# Patient Record
Sex: Female | Born: 2000 | Race: Black or African American | Hispanic: No | Marital: Single | Smoking: Never smoker
Health system: Southern US, Community
[De-identification: ages and names within clinical notes are randomized; demographics above are authoritative.]

## PROBLEM LIST (undated history)

## (undated) DIAGNOSIS — J45909 Unspecified asthma, uncomplicated: Secondary | ICD-10-CM

## (undated) DIAGNOSIS — G4730 Sleep apnea, unspecified: Secondary | ICD-10-CM

## (undated) HISTORY — PX: TONSILLECTOMY: SUR1361

## (undated) HISTORY — PX: WISDOM TOOTH EXTRACTION: SHX21

## (undated) HISTORY — PX: ADENOIDECTOMY: SUR15

---
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
## 2018-02-19 ENCOUNTER — Encounter (HOSPITAL_COMMUNITY): Payer: Self-pay | Admitting: Emergency Medicine

## 2018-02-19 ENCOUNTER — Ambulatory Visit (HOSPITAL_COMMUNITY)
Admission: EM | Admit: 2018-02-19 | Discharge: 2018-02-19 | Disposition: A | Discharge status: Home / Self Care | Payer: Medicaid Other | Attending: Internal Medicine | Admitting: Internal Medicine

## 2018-02-19 ENCOUNTER — Other Ambulatory Visit: Payer: Self-pay

## 2018-02-19 DIAGNOSIS — A084 Viral intestinal infection, unspecified: Secondary | ICD-10-CM | POA: Diagnosis not present

## 2018-02-19 DIAGNOSIS — J4521 Mild intermittent asthma with (acute) exacerbation: Secondary | ICD-10-CM | POA: Diagnosis not present

## 2018-02-19 MED ORDER — ALBUTEROL SULFATE HFA 108 (90 BASE) MCG/ACT IN AERS: 1.0000 | INHALATION_SPRAY | Freq: Four times a day (QID) | RESPIRATORY_TRACT | 0 refills | Status: AC | PRN

## 2018-02-19 NOTE — ED Triage Notes (Signed)
The patient presented to the [facility] with a complaint of a cough x 4 days. 

## 2018-02-19 NOTE — ED Provider Notes (Signed)
[facility]    CSN: [id number] Arrival date & time: 02/19/18  1433     History   Chief Complaint Chief Complaint  Patient presents with  . Cough    HPI Heidi Petersen is a 18 y.o. female comes to the urgent care department on account of cough, sore throat abdominal cramping and diarrhea.  Patient denies any fever or chills.  Appetite remains fair.  Diarrhea is nonbloody.  Is associated with abdominal cramping.  No nausea or vomiting.  Patient has not tried any over-the-counter medications for relief.  HPI  Past Medical History:  Diagnosis Date  . Asthma   . Prematurity   . Sleep apnea     There are no active problems to display for this patient.   Past Surgical History:  Procedure Laterality Date  . ADENOIDECTOMY    . TONSILLECTOMY      OB History   No obstetric history on file.      Home Medications    Prior to Admission medications   Medication Sig Start Date End Date Taking? Authorizing Provider  cetirizine (ZYRTEC) 1 MG/ML syrup Take 10 mLs (10 mg total) by mouth daily. 04/08/12  Yes Lowanda Foster, NP  ibuprofen (ADVIL,MOTRIN) 100 MG/5ML suspension Take 200 mg by mouth every 6 (six) hours as needed for fever.   Yes [provider]  albuterol (PROVENTIL HFA;VENTOLIN HFA) 108 (90 Base) MCG/ACT inhaler Inhale 1-2 puffs into the lungs every 6 (six) hours as needed for wheezing or shortness of breath. 02/19/18   Skilynn Durney, Britta Mccreedy, MD    Family History History reviewed. No pertinent family history.  Social History Social History   Tobacco Use  . Smoking status: Never Smoker  Substance Use Topics  . Alcohol use: No  . Drug use: No     Allergies   Augmentin [amoxicillin-pot clavulanate] and Palgic [carbinoxamine]   Review of Systems Review of Systems  Constitutional: Negative for activity change, appetite change and chills.  HENT: Positive for congestion, rhinorrhea and sore throat. Negative for ear discharge, ear pain, postnasal  drip and voice change.   Eyes: Negative for pain, discharge and itching.  Respiratory: Negative for cough, chest tightness, shortness of breath and wheezing.   Gastrointestinal: Positive for diarrhea. Negative for abdominal distention, abdominal pain, constipation, nausea and vomiting.  Genitourinary: Negative for dysuria, hematuria and urgency.  Musculoskeletal: Negative for arthralgias, back pain and joint swelling.  Allergic/Immunologic: Negative for food allergies.  Neurological: Negative for dizziness, syncope and numbness.  Hematological: Negative for adenopathy.     Physical Exam Triage Vital Signs ED Triage Vitals  Enc Vitals Group     BP 02/19/18 1436 128/69     Pulse Rate 02/19/18 1436 84     Resp 02/19/18 1436 16     Temp 02/19/18 1436 98.9 F (37.2 C)     Temp Source 02/19/18 1436 Temporal     SpO2 02/19/18 1436 99 %     Weight --      Height --      Head Circumference --      Peak Flow --      Pain Score 02/19/18 1437 0     Pain Loc --      Pain Edu? --      Excl. in GC? --    No data found.  Updated Vital Signs BP 128/69 (BP Location: Right Arm)   Pulse 84   Temp 98.9 F (37.2 C) (Temporal)   Resp 16  LMP 01/18/2018   SpO2 99%   Visual Acuity Right Eye Distance:   Left Eye Distance:   Bilateral Distance:    Right Eye Near:   Left Eye Near:    Bilateral Near:     Physical Exam Constitutional:      Appearance: She is not ill-appearing.  HENT:     Right Ear: Tympanic membrane normal.     Left Ear: Tympanic membrane normal.     Nose: No congestion or rhinorrhea.     Mouth/Throat:     Mouth: Mucous membranes are moist.     Pharynx: No oropharyngeal exudate or posterior oropharyngeal erythema.  Eyes:     Conjunctiva/sclera: Conjunctivae normal.     Pupils: Pupils are equal, round, and reactive to light.  Neck:     Musculoskeletal: Normal range of motion. No neck rigidity.  Cardiovascular:     Rate and Rhythm: Normal rate and regular rhythm.      Pulses: Normal pulses.     Heart sounds: No murmur. No gallop.   Pulmonary:     Effort: Pulmonary effort is normal. No respiratory distress.     Breath sounds: Normal breath sounds. No rales.  Abdominal:     General: Bowel sounds are normal. There is no distension.     Tenderness: There is no abdominal tenderness. There is no guarding or rebound.  Skin:    General: Skin is warm and dry.  Neurological:     Mental Status: She is alert.      UC Treatments / Results  Labs (all labs ordered are listed, but only abnormal results are displayed) Labs Reviewed - No data to display  EKG None  Radiology No results found.  Procedures Procedures (including critical care time)  Medications Ordered in UC Medications - No data to display  Initial Impression / Assessment and Plan / UC Course  I have reviewed the triage vital signs and the nursing notes.  Pertinent labs & imaging results that were available during my care of the patient were reviewed by me and considered in my medical decision making (see chart for details).    1.  Acute viral gastroenteritis: Encourage oral fluid intake Hopefully diarrhea will subside  2.  Mild intermittent asthma with acute exacerbation: Albuterol every 6 as needed for wheezing  Final Clinical Impressions(s) / UC Diagnoses   Final diagnoses:  Viral gastroenteritis  Mild intermittent asthma with acute exacerbation   Discharge Instructions   None    ED Prescriptions    Medication Sig Dispense Auth. Provider   albuterol (PROVENTIL HFA;VENTOLIN HFA) 108 (90 Base) MCG/ACT inhaler Inhale 1-2 puffs into the lungs every 6 (six) hours as needed for wheezing or shortness of breath. 1 Inhaler Marvelle Span, Britta Mccreedy, MD     Controlled Substance Prescriptions [place] Controlled Substance Registry consulted? No   Merrilee Jansky, MD 02/20/18 [phone]

## 2019-08-27 ENCOUNTER — Ambulatory Visit (INDEPENDENT_AMBULATORY_CARE_PROVIDER_SITE_OTHER): Payer: Medicaid Other | Admitting: Family Medicine

## 2019-08-27 ENCOUNTER — Encounter: Payer: Self-pay | Admitting: Family Medicine

## 2019-08-27 ENCOUNTER — Other Ambulatory Visit: Payer: Self-pay

## 2019-08-27 VITALS — BP 92/62 | HR 85 | Wt 119.0 lb

## 2019-08-27 DIAGNOSIS — J4520 Mild intermittent asthma, uncomplicated: Secondary | ICD-10-CM | POA: Diagnosis not present

## 2019-08-27 DIAGNOSIS — F411 Generalized anxiety disorder: Secondary | ICD-10-CM | POA: Insufficient documentation

## 2019-08-27 DIAGNOSIS — Z7689 Persons encountering health services in other specified circumstances: Secondary | ICD-10-CM | POA: Diagnosis not present

## 2019-08-27 NOTE — Progress Notes (Signed)
    SUBJECTIVE:   CHIEF COMPLAINT / HPI: Establish care  Heidi Petersen is a 19 year old female presenting to establish care and discuss the following:  She reports she is doing well today, has no concerns.  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  About to start applying for masters program in education and getting anxious about this.  Lives with mom and siblings (5).  Enjoys dance for fun and as exercise.  Lifetime non-smoker.  Denies any illicit drug or alcohol use.  Feels safe in her relationships, denies concern for STDs.   Past medical history: -Seasonal allergies, minimally bothersome not on medication -Asthma, diagnosed in 2014, mild intermittent has not used albuterol inhaler in over a year -On contraception, OCPs -GAD, previously with therapist and trying to reconnect with one, not interested in medications.  Does not interfere with her daily life and managing well currently.  Past surgical history: -Wisdom tooth removal 2019 -Tonsillectomy with adenoid removal in 2003  Family history: Family history of asthma within brother and mother, otherwise no family history of sudden early death, early CAD, colon, or breast cancer.  Health maintenance: Due for hepatitis C, HIV screening, first Pap smear (will get through her OB/GYN)  Previous care team: [facility] previous PCP [facility] for eye exams [facility] for [facility]necology    Office Visit from 08/[facility]  Thoughts that you would be better off dead, or of hurting yourself in some way Not at all  PHQ-9 Total Score 2     GAD 7 : Generalized Anxiety Score 08/27/2019  Nervous, Anxious, on Edge 1  Control/stop worrying 0  Worry too much - different things 1  Trouble relaxing 0  Restless 0  Easily annoyed or irritable 0  Afraid - awful might happen 0  Total GAD 7 Score 2  Anxiety Difficulty Not difficult at all     OBJECTIVE:   BP 92/62   Pulse 85   Wt 119 lb (54 kg)   SpO2 96%   General: Alert, NAD HEENT: NCAT, MMM, oropharynx nonerythematous, thyroid non-enlarged Cardiac: RRR no m/g/r Lungs: Clear bilaterally, no increased WOB  Abdomen: soft, non-tender Msk: Moves all extremities spontaneously  Ext: Warm, dry, 2+ distal pulses, no edema  Derm: no rashes seen  Psych: normal mood and affect, engages in conversation with appropriate eye contact  ASSESSMENT/PLAN:   Encounter to establish care with new doctor Reviewed past medical, surgical, and social history.  Medications updated in epic.  Health maintenance reviewed and will obtain STD screening/Pap smear through upcoming OB/GYN appointment. Encouraged working towards a well-balanced diet and staying physically active.  GAD (generalized anxiety disorder) Mild, GAD-7 score of 2 today.  Patient anticipating increase in her anxiety over the next several months as she begins to apply for masters programs, provided with therapy resources in the community to reestablish care to assist.  Provided reassurance and encouraged breathing/relaxing techniques.  Mild intermittent asthma Has not used albuterol inhaler in over a year, expect breathing to continue to improve as she ages.    Follow-up if needed for above (any worsening of anxiety etc.), otherwise follow-up annually.  Allayne Stack, DO [facility]

## 2019-08-27 NOTE — Assessment & Plan Note (Signed)
Mild, GAD-7 score of 2 today.  Patient anticipating increase in her anxiety over the next several months as she begins to apply for masters programs, provided with therapy resources in the community to reestablish care to assist.  Provided reassurance and encouraged breathing/relaxing techniques.

## 2019-08-27 NOTE — Assessment & Plan Note (Signed)
Has not used albuterol inhaler in over a year, expect breathing to continue to improve as she ages.

## 2019-08-27 NOTE — Assessment & Plan Note (Signed)
Reviewed past medical, surgical, and social history.  Medications updated in epic.  Health maintenance reviewed and will obtain STD screening/Pap smear through upcoming OB/GYN appointment. Encouraged working towards a well-balanced diet and staying physically active.

## 2019-08-27 NOTE — Patient Instructions (Signed)
Therapy and Counseling Resources Most providers on this list will take Medicaid. Patients with commercial insurance or Medicare should contact their insurance company to get a list of in network providers.  [facility]  [address] [place], [place] [address]      [phone]  [facility] [address]  [place], [place] [address] [phone]  [facility] [address]  [place], [place] [address]       [phone]      [facility]ovita Kussmaul [facility] [address] [place], [place] [phone]  [facility]:  [address] [place] [phone]  [facility]:  [address] Diener [phone]  [facility] (under & uninsured) [address]   [place] [phone]    [email or web address]    [facility] [phone] [address] [place]    [phone]  [facility] -[address]     Phone:  [phone]     [facility]-  [address]  [phone]   [facility] [address]      [place], [place] [phone]  [facility]: [address] [place], [place], [place]  [phone]   [facility] (Spanish therapist) [address] [place]  [address]   [place] [address]    [phone]    [facility]   [address],  [place], [place]  [phone]   [facility]  [address] [place]  [phone]  [facility]  [address] [place], [place]        [phone]  Open Access/Walk In Clinic under & uninsured  [facility]  [address] [place], [place] [phone] Crisis [phone]  [facility] [place],  (Spanish)   [address], [place]: [phone]) 8:30 - 12; 1 - 2:30  [facility] of the  [employer],  [address] [place], [place]    [phone]):8:30 - 12; 2 - 3PM  [facility] [employer],  [address]  [place]; [phone]):   Mon - Fri 8 AM - 5 PM  [facility] [address] [place]  MWF 12:30 to 3:00 or call to schedule an appointment  [phone]  Specific Provider options Psychology Today  [email or web address].com/us 1. click on find a therapist  2. enter your zip code 3. left side and select or tailor a therapist for your specific need.   [facility] Provider Directory [email or web address]  (Medicaid)   Follow all drop down to find a provider  Social Support program [facility] [phone] or [employer] [address], [place], [place] Recovery support and educational   24- Hour Availability:  .  Marland Kitchen [facility]  . [address] [place], [place] [employer] [phone] Crisis [phone]  . [facility] [phone]  [facility] Crisis Service  [phone]   . [facility] [employer]  [phone] (after hours)  . Therapeutic Alternative/Mobile Crisis   [phone]  . [place] National Suicide Hotline  [phone]) [phone] (TALK)  . Call 911 or go to emergency room  . [employer]  [phone]);  [place] and [employer]   . [facility]  [phone]); [place], [employer], [place], [place], [place], [place], [place]

## 2019-10-01 ENCOUNTER — Ambulatory Visit: Payer: Medicaid Other

## 2019-10-10 ENCOUNTER — Encounter: Payer: Self-pay | Admitting: Family Medicine

## 2019-10-10 ENCOUNTER — Other Ambulatory Visit: Payer: Self-pay

## 2019-10-10 ENCOUNTER — Ambulatory Visit (INDEPENDENT_AMBULATORY_CARE_PROVIDER_SITE_OTHER): Payer: Medicaid Other | Admitting: Family Medicine

## 2019-10-10 VITALS — BP 100/60 | HR 85 | Ht 66.0 in | Wt 116.0 lb

## 2019-10-10 DIAGNOSIS — Z111 Encounter for screening for respiratory tuberculosis: Secondary | ICD-10-CM

## 2019-10-10 NOTE — Patient Instructions (Signed)
It was great seeing you today! We completed your physical and everything looks great!   We will see you back for your pap appointment, but if you need to be seen earlier than that for any new issues we're happy to fit you in, just give [place] a call!  Visit Reminders: - To Do: One time HIV and Hep C screening at your next visit - return on Friday for your ppd reading    If you haven't already, sign up for My Chart to have easy access to your labs results, and communication with your primary care physician.  Feel free to call with any questions or concerns at any time, at [phone].   Take care,  Dr. Cora Collum [facility]

## 2019-10-10 NOTE — Progress Notes (Signed)
    SUBJECTIVE:   CHIEF COMPLAINT / HPI:   Patient presents for her annual physical and to get Tb testing for her job. She works as a Lawyer. She states she exercises daily mainly by dancing. Denies tobacco or recreational drug use. Denies alcohol use. States she is currently sexually active with one partner and was recently tested for STIs earlier in the month. She endorses getting tested annually. She feels safe in her relationship. She uses  Denies any issues with her periods. Denies any concerns at this visit today.   PERTINENT  PMH / PSH:  GAD, mild intermittent asthma   OBJECTIVE:   BP 100/60   Pulse 85   Ht 5\' 6"  (1.676 m)   Wt 116 lb (52.6 kg)   LMP 10/10/2019   SpO2 99%   BMI 18.72 kg/m    Physical Exam Cardiovascular:     Rate and Rhythm: Normal rate and regular rhythm.     Pulses: Normal pulses.     Heart sounds: Normal heart sounds.  Pulmonary:     Effort: Pulmonary effort is normal.     Breath sounds: Normal breath sounds.  Abdominal:     Palpations: Abdomen is soft.     Tenderness: There is no abdominal tenderness.  Skin:    General: Skin is warm and dry.     ASSESSMENT/PLAN:   No problem-specific Assessment & Plan notes found for this encounter.  Health maintenance  Pap smear scheduled for 10/21. Patient states she will get her Tdap at next visit to avoid arm pain before her dance program later this evening. Will also get HIV and Hep C screen at next visit  - Tb screen for job. F/u on reading 10/1 at 4:15p - completed physical form for work   Mild intermittent asthma Patient reports using her Albuterol on rare occasions when hiking or doing increased activity about once a month.    11/21, DO [facility]

## 2019-10-12 ENCOUNTER — Other Ambulatory Visit: Payer: Self-pay

## 2019-10-12 ENCOUNTER — Ambulatory Visit (INDEPENDENT_AMBULATORY_CARE_PROVIDER_SITE_OTHER): Payer: Medicaid Other

## 2019-10-12 DIAGNOSIS — Z23 Encounter for immunization: Secondary | ICD-10-CM | POA: Diagnosis present

## 2019-10-12 DIAGNOSIS — Z111 Encounter for screening for respiratory tuberculosis: Secondary | ICD-10-CM

## 2019-10-12 LAB — TB SKIN TEST
Induration: 0 mm
TB Skin Test: NEGATIVE

## 2019-10-12 NOTE — Progress Notes (Signed)
PPD Reading Note PPD read and results entered in EpicCare. Result: 0 mm induration. Interpretation: Negative Allergic reaction: no  Patient requests Tdap Vaccine for employment. She is due as of 08/2019.   Tdap given RD withourt complication.   See admin for details.

## 2019-10-15 ENCOUNTER — Telehealth: Payer: Self-pay | Admitting: Family Medicine

## 2019-10-15 NOTE — Telephone Encounter (Signed)
Health  Exam Certificate form dropped off for at front desk for completion.  Verified that patient section of form has been completed.  Last DOS/WCC with PCP was 08/27/19   Placed form in team folder to be completed by clinical staff.  Grayce Fredda Hammed

## 2019-10-15 NOTE — Telephone Encounter (Signed)
Reviewed form and placed in PCP's box for completion.  .Nickey Kloepfer R Mishell Donalson, CMA  

## 2019-10-19 NOTE — Telephone Encounter (Signed)
Form completed and placed in RN box. Thank you!   Solangel Mcmanaway N Archita Lomeli, DO  

## 2019-10-23 NOTE — Telephone Encounter (Signed)
Patient presents to front office requesting form. Provided patient with form and immunization record. Copy made and placed in batch scanning.   Veronda Prude, RN

## 2019-11-01 ENCOUNTER — Ambulatory Visit (INDEPENDENT_AMBULATORY_CARE_PROVIDER_SITE_OTHER): Payer: Medicaid Other | Admitting: Family Medicine

## 2019-11-01 ENCOUNTER — Other Ambulatory Visit: Payer: Self-pay

## 2019-11-01 VITALS — BP 100/70 | HR 69 | Ht 66.0 in | Wt 117.2 lb

## 2019-11-01 DIAGNOSIS — Z124 Encounter for screening for malignant neoplasm of cervix: Secondary | ICD-10-CM

## 2019-11-01 NOTE — Progress Notes (Signed)
Patient scheduled appointment today to get her Pap smear completed.  She was recently told by her OB/GYN that her insurance would no longer be covering Pap smears and so they did not do it, however recommended follow-up with her PCP.  She has no other concerns.  Through brief review of her Medicaid [employer], believe that it should cover her Pap smear especially for a 19 year old.  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  Rescheduled with myself on 10/28 at 11:10 AM pending insurance approval.  Allayne Stack, DO

## 2019-11-08 ENCOUNTER — Ambulatory Visit: Payer: Medicaid Other | Admitting: Family Medicine

## 2019-11-21 ENCOUNTER — Encounter: Payer: Self-pay | Admitting: Family Medicine

## 2019-11-21 ENCOUNTER — Other Ambulatory Visit (HOSPITAL_COMMUNITY)
Admission: RE | Admit: 2019-11-21 | Discharge: 2019-11-21 | Disposition: A | Discharge status: Home / Self Care | Payer: Medicaid Other | Source: Ambulatory Visit | Attending: Family Medicine | Admitting: Family Medicine

## 2019-11-21 ENCOUNTER — Ambulatory Visit (INDEPENDENT_AMBULATORY_CARE_PROVIDER_SITE_OTHER): Payer: Medicaid Other | Admitting: Family Medicine

## 2019-11-21 ENCOUNTER — Other Ambulatory Visit: Payer: Self-pay

## 2019-11-21 VITALS — BP 92/60 | HR 81 | Ht 66.0 in | Wt 121.2 lb

## 2019-11-21 DIAGNOSIS — Z124 Encounter for screening for malignant neoplasm of cervix: Secondary | ICD-10-CM | POA: Insufficient documentation

## 2019-11-21 DIAGNOSIS — Z01419 Encounter for gynecological examination (general) (routine) without abnormal findings: Secondary | ICD-10-CM | POA: Diagnosis not present

## 2019-11-21 DIAGNOSIS — Z0000 Encounter for general adult medical examination without abnormal findings: Secondary | ICD-10-CM

## 2019-11-21 DIAGNOSIS — L819 Disorder of pigmentation, unspecified: Secondary | ICD-10-CM | POA: Diagnosis not present

## 2019-11-21 NOTE — Progress Notes (Signed)
    SUBJECTIVE:   CHIEF COMPLAINT / HPI: papsmear   Heidi Petersen is a 19 year old female presenting for her Pap smear.  She reports she otherwise is doing well, currently in college (senior year) and applying to grad school.  Has no concerns.  She is sexually active with a female partner.  Intermittently uses condoms, on OCPs and takes on a consistent basis.  Denies any changes in her discharge, hematuria, dysuria, abdominal pain, back pain, vaginal itching or irritation.   She also requests a dermatology referral.  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  Had biopsy of diagnosis.  States she will have hypopigmentation around both of her eyes every winter, previously prescribed steroids and still has some at home which she has been taking for it.  Uses face cream every day.  Not itchy or painful.  PERTINENT  PMH / PSH: Mild intermittent asthma, mild anxiety  OBJECTIVE:   BP 92/60   Pulse 81   Ht 5\' 6"  (1.676 m)   Wt 121 lb 4 oz (55 kg)   LMP 10/26/2019   SpO2 98%   BMI 19.57 kg/m   General: Alert, NAD HEENT: NCAT, MMM, one 1-2cm hypopigmented patch present on her upper outer left eyelid and another circular 1 cm hypopigmented patch underneath left eye on upper cheek.  Nonpainful or rough to palpation. Lungs: No increased WOB  Abdomen: soft, non-tender Msk: Moves all extremities spontaneously  Ext: Warm, dry, 2+ distal pulses Pelvic exam: VULVA: normal appearing vulva with no masses, tenderness or lesions, VAGINA: normal appearing vagina with normal color and discharge, no lesions, CERVIX: normal appearing cervix without discharge or lesions, UTERUS: uterus is normal size, shape, consistency and nontender, ADNEXA: normal adnexa in size, nontender and no masses.  Chaperoned by CMA.  ASSESSMENT/PLAN:   Women's annual routine gynecological examination Reviewed past medical, surgical, and social history.   Medications updated in epic.  Health maintenance reviewed and performed Pap smear with asymptomatic gc/ch/trich screening.  Declined HIV, RPR, and hep C screening for now as she will get these through her gynecologist in 12/2019. Encouraged working towards a well-balanced diet and staying physically active.  Hypopigmented skin lesion Likely postinflammatory, previously diagnosed as eczematous in nature.  Recommended continued use of prescribed steroid cream twice daily for the next 2 weeks and daily emollient use.  Will place dermatology referral, however with Medicaid may need to return to [facility] for follow-up.    Follow-up annually or sooner if needed.  [facility], DO [facility]

## 2019-11-21 NOTE — Patient Instructions (Signed)
It was wonderful to see you today.  Your Pap smear results should be back in the next several days, letter or call you next week.  I also recommend you continue using the steroids and use a face lotion every day.  I will place a referral to dermatology, however understanding can be difficult with Medicaid in [place].

## 2019-11-21 NOTE — Assessment & Plan Note (Addendum)
Reviewed past medical, surgical, and social history.  Medications updated in epic.  Health maintenance reviewed and performed Pap smear with asymptomatic gc/ch/trich screening.  Declined HIV, RPR, and hep C screening for now as she will get these through her gynecologist in 12/2019. Encouraged working towards a well-balanced diet and staying physically active.

## 2019-11-21 NOTE — Assessment & Plan Note (Signed)
Likely postinflammatory, previously diagnosed as eczematous in nature.  Recommended continued use of prescribed steroid cream twice daily for the next 2 weeks and daily emollient use.  Will place dermatology referral, however with Medicaid may need to return to [facility] for follow-up.

## 2019-11-26 LAB — CYTOLOGY - PAP
Chlamydia: NEGATIVE
Comment: NEGATIVE
Comment: NEGATIVE
Comment: NORMAL
Diagnosis: NEGATIVE
Neisseria Gonorrhea: NEGATIVE
Trichomonas: NEGATIVE

## 2019-11-27 ENCOUNTER — Encounter: Payer: Self-pay | Admitting: Family Medicine

## 2020-01-03 ENCOUNTER — Other Ambulatory Visit: Payer: Self-pay

## 2020-01-03 ENCOUNTER — Ambulatory Visit (INDEPENDENT_AMBULATORY_CARE_PROVIDER_SITE_OTHER): Payer: Medicaid Other | Admitting: Family Medicine

## 2020-01-03 VITALS — BP 101/70 | HR 88 | Temp 98.9°F | Ht 65.0 in

## 2020-01-03 DIAGNOSIS — R059 Cough, unspecified: Secondary | ICD-10-CM

## 2020-01-03 NOTE — Progress Notes (Signed)
    SUBJECTIVE:   CHIEF COMPLAINT / HPI:   Cough: : headaches - severe frontal, bilateral headaches since Monday. Cough and runny nose since yesterday.  Dry cough. occasional sneezing.  No n/v/d.  Pt believes she is eating less due to being on her period.  No fevers.  No sick contacts she is aware of.  Currently In college.  Not covid or flu vaccinated.    PERTINENT  PMH / PSH: asthma  OBJECTIVE:   BP 101/70   Pulse 88   Temp 98.9 F (37.2 C)   Ht 5\' 5"  (1.651 m)   LMP 01/03/2020 (Exact Date)   SpO2 98%   BMI 20.18 kg/m   Gen: alert, oriented.  Heent: moist oral mucosa. No oropharyngeal erythema. No sinus tenderness. Cv: rrr. No murmurs Pulm: lctab. No wheezes or crackles.  Abd: soft, nontender.   ASSESSMENT/PLAN:   Cough Symptoms consistent with viral respiratory infection. covid is possible.  Not vaccinated against covid or flu.  Will get covid test today. Advised pt to isolate until results return.  Symptomatic treatment discussed. Return precautions/red flag symptoms discussed.      01/05/2020, MD [facility]

## 2020-01-03 NOTE — Patient Instructions (Signed)
It was nice to meet today,  I think your symptoms are consistent with a viral upper respiratory infection, but I cannot rule out COVID without a test. We have performed a nasal swab on you today but it will take 1 to 2 days at the earliest to get the results. Another option for faster results are the rapid at home, tests which are available for as little as $10 depending on which pharmacy you use.  Please try to isolate yourself from others and to get confirmation of a negative Covid test.  You can treat yourself symptomatically with Tylenol and ibuprofen for pain or discomfort. Drink plenty of fluids.  If you have any difficulty breathing please seek medical attention.  Have a great day,  Frederic Jericho, MD

## 2020-01-05 LAB — SARS-COV-2, NAA 2 DAY TAT

## 2020-01-05 LAB — NOVEL CORONAVIRUS, NAA: SARS-CoV-2, NAA: DETECTED — AB

## 2020-01-06 ENCOUNTER — Telehealth: Payer: Self-pay | Admitting: Family Medicine

## 2020-01-06 NOTE — Telephone Encounter (Signed)
Called pt to inform her of positive test results for covid.  Work note provided.

## 2020-01-07 DIAGNOSIS — R059 Cough, unspecified: Secondary | ICD-10-CM | POA: Insufficient documentation

## 2020-01-07 NOTE — Assessment & Plan Note (Signed)
Symptoms consistent with viral respiratory infection. covid is possible.  Not vaccinated against covid or flu.  Will get covid test today. Advised pt to isolate until results return.  Symptomatic treatment discussed. Return precautions/red flag symptoms discussed.

## 2020-05-26 ENCOUNTER — Encounter: Payer: Self-pay | Admitting: Family Medicine

## 2020-05-26 ENCOUNTER — Other Ambulatory Visit: Payer: Self-pay

## 2020-05-26 ENCOUNTER — Ambulatory Visit (INDEPENDENT_AMBULATORY_CARE_PROVIDER_SITE_OTHER): Payer: Medicaid Other | Admitting: Family Medicine

## 2020-05-26 VITALS — BP 100/58 | HR 88 | Ht 65.0 in | Wt 116.8 lb

## 2020-05-26 DIAGNOSIS — H9202 Otalgia, left ear: Secondary | ICD-10-CM | POA: Insufficient documentation

## 2020-05-26 DIAGNOSIS — H6982 Other specified disorders of Eustachian tube, left ear: Secondary | ICD-10-CM | POA: Diagnosis not present

## 2020-05-26 MED ORDER — FLUTICASONE PROPIONATE 50 MCG/ACT NA SUSP: 1.0000 | Freq: Every day | NASAL | 2 refills | Status: AC

## 2020-05-26 NOTE — Progress Notes (Signed)
    SUBJECTIVE:   CHIEF COMPLAINT / HPI: Ringing in ear  Heidi Petersen is an otherwise healthy 20 year old female presenting for evaluation of ringing in her left ear.  She reports this initially started yesterday afternoon, associated with some ear pain and hyperacusis (feels like she is hearing things louder than usual).  Ringing would last for about a minute, has happened about 3-4 times today.  Prior to this, her air pods were ringing and making a lot of weird noises, however she stopped wearing these over the past 2 days and now continues to experience ringing in her ear.  Unable to describe pain quality.  Ear does not feel full or have frequent popping.  Denies any fever, ear drainage, hearing loss, jaw clicking, pain upon pulling pina, headache, or rash.  She does have a history of allergies and states consistently congested, not using any medications or nasal sprays.  No new medications.   PERTINENT  PMH / PSH: Seasonal allergies, mild intermittent asthma  OBJECTIVE:   BP (!) 100/58   Pulse 88   Ht 5\' 5"  (1.651 m)   Wt 116 lb 12.8 oz (53 kg)   LMP 05/17/2020   SpO2 100%   BMI 19.44 kg/m   General: Alert, NAD HEENT: NCAT, MMM, oropharynx nonerythematous, right TM clear with appropriate light reflex, left TM clear with possible effusion no erythema or bulging.  External canals normal bilaterally.  No pain upon pulling the pinna on the left. Lungs: No increased WOB  Msk: Normal gait  Ext: Warm, dry, 2+ distal pulses  ASSESSMENT/PLAN:   Ear pain, left Acute, associated with tinnitus and hyperacusis.  Unremarkable otic exam with the exception of a possible very small effusion present behind her left TM.  No findings suggestive of otitis media or externa.  Given her history, could certainly consider eustachian tube dysfunction with her longstanding history of allergic rhinitis.  Rx'd Flonase daily and recommended avoiding her air pods until fixed as this could have also been contributing.      Follow-up if not improving or sooner if worsening.  07/17/2020, DO [facility]

## 2020-05-26 NOTE — Patient Instructions (Signed)
It was wonderful to see you today.  This may be in the setting of your air pods or possibly eustachian tube dysfunction.  We will try Flonase daily and see if this makes a difference, sometimes may take several weeks to improve.  If you feel like things are getting worse such as worsening ear pain, ringing, any fever, or ear drainage please follow-up.

## 2020-05-26 NOTE — Assessment & Plan Note (Addendum)
Acute, associated with tinnitus and hyperacusis.  Unremarkable otic exam with the exception of a possible very small effusion present behind her left TM.  No findings suggestive of otitis media or externa.  Given her history, could certainly consider eustachian tube dysfunction with her longstanding history of allergic rhinitis.  Rx'd Flonase daily and recommended avoiding her air pods until fixed as this could have also been contributing.

## 2020-12-02 ENCOUNTER — Other Ambulatory Visit: Payer: Self-pay

## 2020-12-02 ENCOUNTER — Ambulatory Visit (INDEPENDENT_AMBULATORY_CARE_PROVIDER_SITE_OTHER): Payer: Medicaid Other | Admitting: Family Medicine

## 2020-12-02 VITALS — BP 98/58 | HR 79 | Wt 120.0 lb

## 2020-12-02 DIAGNOSIS — M25511 Pain in right shoulder: Secondary | ICD-10-CM | POA: Diagnosis not present

## 2020-12-02 MED ORDER — NAPROXEN 500 MG PO TABS: 500.0000 mg | ORAL_TABLET | Freq: Two times a day (BID) | ORAL | 0 refills | Status: DC

## 2020-12-02 NOTE — Patient Instructions (Addendum)
It was great to meet you!  Your shoulder pain is most likely a muscular problem that involves some of the muscles of your back and posterior shoulder.  I have placed a referral to physical therapy to help with your symptoms and to strengthen those muscles to prevent future problems. I have also sent a prescription for an anti-inflammatory medication to take for the next week.  If your pain is getting worse or not improving with these steps, please let us know and we could consider additional testing or a referral to sports medicine.  Dr. [facility]

## 2020-12-02 NOTE — Assessment & Plan Note (Signed)
Patient with one month of right scapular pain in the absence of known trauma/injury. Suspect muscular etiology given her dance activities, largely unremarkable exam other than tenderness to palpation, and absence of neuro symptoms. Possible subscapularis tightness/injury vs more likely trapezius or rhomboid strain.  -Naproxen 500mg  BID x7 days -Referral to physical therapy -Return precautions reviewed -If worsening or no improvement, could consider imaging or sports med referral in the future

## 2020-12-02 NOTE — Progress Notes (Signed)
    SUBJECTIVE:   CHIEF COMPLAINT / HPI:   R Shoulder Pain Symptoms started 1 month ago. Located in right scapula region. Waxes and wanes in severity, some days she has 10/10 pain, other days it's mild (3/10), and some days she has no pain at all. No specific aggravating factors other than palpation of the area. Has tried heating pads and ice packs without significant relief. Has not tried any medications. No known injury or trauma. 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  No prior history of shoulder pain or injury.  She is R hand dominant. No numbness/tingling radiating down arm, no weakness of her arm, no neck pain or midline back pain, no systemic symptoms.  PERTINENT  PMH / PSH: none  OBJECTIVE:   BP (!) 98/58   Pulse 79   Wt 120 lb (54.4 kg)   LMP 11/28/2020   SpO2 97%   BMI 19.97 kg/m   Gen: alert, well-appearing, NAD Resp: normal work of breathing Neuro: grossly intact, sensation to light touch intact in bilateral upper extremities, normal gait, normal speech Psych: appropriate mood and affect Back: no midline bony tenderness in cervical, thoracic, or lumbar regions, full ROM with flexion, extension and rotation Right Shoulder: Inspection reveals no obvious deformity, atrophy, asymmetry, or swelling. No tenderness to palpation over [facility] joint or bicipital groove. Pain with palpation just medial to right posterior scapula. Full ROM in flexion, abduction, internal/external rotation NV intact distally Special Tests:  - Impingement: Neg Hawkins - Supraspinatous: Negative empty can.  5/5 strength with resisted flexion at 20 degrees - Subscapularis: positive belly press - Biceps tendon: Negative Speeds - AC Joint: Negative cross arm - No painful arc and no drop arm sign   ASSESSMENT/PLAN:   Right shoulder pain Patient with one month of right scapular pain in the absence of known trauma/injury. Suspect muscular etiology given her dance  activities, largely unremarkable exam other than tenderness to palpation, and absence of neuro symptoms. Possible subscapularis tightness/injury vs more likely trapezius or rhomboid strain.  -Naproxen 500mg  BID x7 days -Referral to physical therapy -Return precautions reviewed -If worsening or no improvement, could consider imaging or sports med referral in the future     , MD [facility]

## 2020-12-06 ENCOUNTER — Other Ambulatory Visit: Payer: Self-pay | Admitting: Family Medicine

## 2020-12-06 DIAGNOSIS — M25511 Pain in right shoulder: Secondary | ICD-10-CM

## 2020-12-13 ENCOUNTER — Other Ambulatory Visit: Payer: Self-pay | Admitting: Family Medicine

## 2020-12-13 DIAGNOSIS — M25511 Pain in right shoulder: Secondary | ICD-10-CM

## 2020-12-17 ENCOUNTER — Encounter: Payer: Self-pay | Admitting: Physical Therapy

## 2020-12-17 ENCOUNTER — Other Ambulatory Visit: Payer: Self-pay

## 2020-12-17 ENCOUNTER — Ambulatory Visit: Payer: Medicaid Other | Attending: Family Medicine | Admitting: Physical Therapy

## 2020-12-17 DIAGNOSIS — M25511 Pain in right shoulder: Secondary | ICD-10-CM | POA: Insufficient documentation

## 2020-12-17 DIAGNOSIS — M6281 Muscle weakness (generalized): Secondary | ICD-10-CM | POA: Insufficient documentation

## 2020-12-17 DIAGNOSIS — M546 Pain in thoracic spine: Secondary | ICD-10-CM | POA: Diagnosis not present

## 2020-12-17 NOTE — Therapy (Addendum)
[facility] [address] [place], [place], [address] Phone: [phone]   Fax:  [phone]  Physical Therapy Evaluation  Patient Details  Name: Heidi Petersen MRN: [id number] Date of Birth: [birth year] Referring Provider (PT): Billey Co, MD  Encounter Date: 12/17/2020   PT End of Session - 12/18/20 0858     Visit Number 1    Number of Visits 16    Date for PT Re-Evaluation 02/12/21    Authorization Type [employer] MCD    PT Start Time 0545    PT Stop Time 0625    PT Time Calculation (min) 40 min             Past Medical History:  Diagnosis Date   Asthma    Prematurity    Sleep apnea     Past Surgical History:  Procedure Laterality Date   ADENOIDECTOMY     TONSILLECTOMY      There were no vitals filed for this visit.    Subjective Assessment - 12/18/20 0858     Subjective Heidi Petersen is a 20 y.o. female who presents to clinic with chief complaint of R Shoulder pain .  MOI/History of condition: onset about 1.5 months.  Intermittent - sxs vary to absent to severe.  She is a Tourist information centre manager.  Denies trauma.  Pain location: medial periscapular musculature.  Red flags: denies denies n/t.  48 hour pain intensity:  highest 10/10, current 6/10, 0/10.  Aggs: reports that it is not activity dependent "there some days and not others".  Eases: pain medication (maker her tired), yoga.  Nature: strong "bad: stretch.  Severity: high.  Irritability: low.  Stage: Subacute.  Stability: unchanged.  24 hour pattern: NA.  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  Hobbies: Yoga, dance.  Functional limitations/goals: upper body movements (driving, dancing, sitting in school).  Home environment: lives alone.  Assistive device: none    Pertinent History Significant PMH: asthma                [facility] PT Assessment - 12/18/20 0001       Assessment   Medical Diagnosis Referral diagnosis: Right shoulder pain,  unspecified chronicity (M25.511)    Referring Provider (PT) Billey Co, MD    Onset Date/Surgical Date 10/17/20    Hand Dominance Right      Precautions   Precaution Comments none      Restrictions   Other Position/Activity Restrictions none      Balance Screen   Has the patient fallen in the past 6 months No      Prior Function   Level of Independence Independent      Observation/Other Assessments   Observations flat tx spine      Sensation   Light Touch Appears Intact      AROM   Overall AROM Comments Full ROM bil shoulders, some shoulder hiking L shoulder with flexion and excessive scapular retraction bil with lowering; Cx spine clear      Strength   Overall Strength Comments L shoulder WNL, bil mid trap 4/5, bil lower trap 3+/5    Right Shoulder Flexion 4/5    Right Shoulder ABduction 4/5    Right Shoulder Internal Rotation 5/5   w/ *P!   Right Shoulder External Rotation 5/5      Flexibility   Soft Tissue Assessment /Muscle Length --   bil pec WNL     Palpation   Palpation comment TTP R rhomboids, lower  trap and Tx paraspinals at ~T8 level      Special Tests   Other special tests spurlings (-)                        Objective measurements completed on examination: See above findings.         Trigger Point Dry Needling - 12/18/20 0001     Consent Given? Yes    Education Handout Provided No    Muscles Treated Upper Quadrant Rhomboids;Lower trapezius    Rhomboids Response Palpable increased muscle length    Lower trapezius Response Palpable increased muscle length            Trigger Point Dry Needling Treatment: Pre-treatment instruction: Patient instructed on dry needling rationale, procedures, and possible side effects including pain during treatment (achy,cramping feeling), bruising, drop of blood, lightheadedness, nausea, sweating. Post-treatment instructions: Patient instructed to expect possible mild to moderate muscle  soreness later today and/or tomorrow. Patient instructed in methods to reduce muscle soreness and to continue prescribed HEP. If patient was dry needled over the lung field, patient was instructed on signs and symptoms of pneumothorax and, however unlikely, to see immediate medical attention should they occur. Patient was also educated on signs and symptoms of infection and to seek medical attention should they occur. Patient verbalized understanding of these instructions and education.        PT Education - 12/18/20 0855     Education Details POC, diagnosis, prognosis, HEP.  Pt educated via explanation, demonstration, and handout (HEP).  Pt confirms understanding verbally.              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 female who presents to clinic with signs and sxs consistent with R sided myofacial pain which is intermittent in nature.  Reproduction of sxs with palpation of tx  paraspinals, lower trap, rhomboids.  TDN was relieving to rhomboid and lower trap pain, but discomfort remained in tx paraspinals.  Pt presents with pain and impairments/deficits in: UE strength, some scapular control deficits.  Activity limitations include: pain is intermittent but interferes with sitting and standing for long periods.  Participation limitations include: dance, yoga, school.  Pt will benefit from skilled therapy to address pain and the listed deficits in order to achieve functional goals, enable safety and independence in completion of daily tasks, and return to PLOF.    Stability/Clinical Decision Making Stable/Uncomplicated    Clinical Decision Making Low    Rehab Potential Good    PT Frequency 2x / week    PT Duration 8 weeks    PT Treatment/Interventions ADLs/Self Care Home Management;Aquatic Therapy;Electrical Stimulation;Iontophoresis 4mg /ml Dexamethasone;Therapeutic activities;Therapeutic exercise;Neuromuscular re-education;Manual techniques;Dry needling;Taping;Spinal Manipulations    PT Next Visit Plan monitor effect of TDN (needle tx multifidus and sub scap),  Tx manip,  periscapular strengthening (concentration on lower traps), scapular motor control    PT Home Exercise Plan [email or web address]             Patient will benefit from skilled therapeutic intervention in order to improve the following deficits and impairments:  Pain, Decreased strength  Visit Diagnosis: Pain in thoracic spine - Plan: PT plan of care cert/re-cert  Muscle weakness - Plan: PT plan of care cert/re-cert     Problem List Patient Active Problem List   Diagnosis Date Noted   Right shoulder pain 12/02/2020   Hypopigmented skin lesion 11/21/2019   GAD (generalized anxiety disorder) 08/27/2019   Mild intermittent asthma 08/27/2019    08/29/2019, PT 12/18/2020, 9:08 AM  [facility] [address] [place], [place], [place] Phone:  [phone]   Fax:  [phone]  Name: Heidi Petersen MRN: Heidi Petersen Date of Birth: 11/21/2000

## 2020-12-18 NOTE — Patient Instructions (Signed)
Access Code: [email or web address] URL: [email or web address] Date: 12/18/2020 Prepared by: Alphonzo Severance  Exercises Low Trap Setting at [facility] - 1 x daily - 7 x weekly - 3 sets - 10 reps

## 2020-12-23 ENCOUNTER — Ambulatory Visit: Payer: Medicaid Other

## 2020-12-25 ENCOUNTER — Other Ambulatory Visit: Payer: Self-pay

## 2020-12-25 ENCOUNTER — Ambulatory Visit: Payer: Medicaid Other

## 2020-12-25 DIAGNOSIS — M6281 Muscle weakness (generalized): Secondary | ICD-10-CM

## 2020-12-25 DIAGNOSIS — M546 Pain in thoracic spine: Secondary | ICD-10-CM | POA: Diagnosis not present

## 2020-12-25 NOTE — Patient Instructions (Signed)
Access Code: [email or web address] URL: [email or web address] Date: 12/25/2020 Prepared by: Gustavus Bryant  Exercises Low Trap Setting at [facility] - 1 x daily - 7 x weekly - 3 sets - 10 reps Seated Scalenes Stretch - 2 x daily - 7 x weekly - 1 sets - 3 reps - 30s hold Prone Shoulder Horizontal Abduction - 2 x daily - 7 x weekly - 1 sets - 30 reps Prone Scapular Slide with Shoulder Extension - 2 x daily - 7 x weekly - 1 sets - 30 reps Prone Single Arm Shoulder Y - 2 x daily - 7 x weekly - 1 sets - 30 reps

## 2020-12-25 NOTE — Therapy (Signed)
[place], [place], [address] Phone: [phone]   Fax:  [phone]  Physical Therapy Treatment  Patient Details  Name: Veera Stapleton MRN: [id number] Date of Birth: [birth year] Referring Provider (PT): Lenoria Chime, MD   Encounter Date: 12/25/2020   PT End of Session - 12/25/20 1232     Visit Number 2    Number of Visits 16    Date for PT Re-Evaluation 02/12/21    Authorization Type [employer] MCD    PT Start Time 1230    PT Stop Time 1315    PT Time Calculation (min) 45 min             Past Medical History:  Diagnosis Date   Asthma    Prematurity    Sleep apnea     Past Surgical History:  Procedure Laterality Date   ADENOIDECTOMY     TONSILLECTOMY      There were no vitals filed for this visit.    Todays session focused on manual techniques as well as hands on assessment to identify aggravating and relieving factors to R shoulder symptoms     [facility] Adult PT Treatment/Exercise - 12/25/20 0001       Transfers   Transfers Independent with all Transfers      Ambulation/Gait   Ambulation/Gait Yes    Ambulation/Gait Assistance 7: Independent      Shoulder Exercises: Prone   Flexion Strengthening;Right;15 reps    Extension Strengthening;Right;15 reps    Horizontal ABduction 1 Strengthening;Right;Limitations    Horizontal ABduction 1 Limitations in IR    Horizontal ABduction 2 Strengthening;Right;15 reps;Limitations    Horizontal ABduction 2 Limitations in ER      Manual Therapy   Manual Therapy Joint mobilization;Soft tissue mobilization;Muscle Energy Technique    Manual therapy comments Inferior mobilization of R first rib    Joint Mobilization upper thoracic mobs into extension and L rotation, focus at T3 level    Muscle Energy Technique R shoulder flexion with first rib depressd manually                       PT Short Term Goals - 12/18/20 0900        PT SHORT TERM GOAL #1   Title Dejuana will be >75% HEP compliant to improve carryover between sessions and facilitate independent management of condition    Status New    Target Date 01/08/21               PT Long Term Goals - 12/18/20 0900       PT LONG TERM GOAL #1   Title Target date for all LTGs: 02/11/2021      PT LONG TERM GOAL #2   Title Anneli will report >/= 50% decrease in pain from evaluation    EVAL: 10/10 max pain    Status New      PT LONG TERM GOAL #3   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  Added scalene stretching to HEP and peformed posterior shoulder strengthening in prone for patient to perform at home.  Good tolerance to inferior mobs to R rib with both manual technique and MET into soulder flexion.  Patient remains a good candidate for OPPT to resolve soft tissue dysfunction in R shoulder.    Stability/Clinical Decision Making Stable/Uncomplicated    Rehab Potential Good    PT Frequency 2x / week    PT Duration 8 weeks    PT Treatment/Interventions ADLs/Self Care Home Management;Aquatic Therapy;Electrical Stimulation;Iontophoresis 46m/ml Dexamethasone;Therapeutic activities;Therapeutic exercise;Neuromuscular re-education;Manual techniques;Dry needling;Taping;Spinal Manipulations    PT Next Visit Plan monitor effect of TDN (needle tx multifidus and sub  scap),  Tx manip,  periscapular strengthening (concentration on lower traps), scapular motor control    PT Home Exercise Plan [email or web address]             Patient will benefit from skilled therapeutic intervention in order to improve the following deficits and impairments:  Pain, Decreased strength  Visit Diagnosis: Muscle weakness  Pain in thoracic spine     Problem List Patient Active Problem List   Diagnosis Date Noted   Right shoulder pain 12/02/2020   Hypopigmented skin lesion 11/21/2019   GAD (generalized anxiety disorder) 08/27/2019   Mild intermittent asthma 08/27/2019    JLanice Shirts PT 12/25/2020, 2:24 PM  [facility]GM[address] [place] [address]Phone: [phone]  Fax:  [phone] Name: YPorshe FleagleMRN: [id number] of Birth: [birth year]

## 2020-12-30 ENCOUNTER — Ambulatory Visit: Payer: Medicaid Other | Admitting: Physical Therapy

## 2020-12-30 ENCOUNTER — Encounter: Payer: Self-pay | Admitting: Physical Therapy

## 2020-12-30 ENCOUNTER — Other Ambulatory Visit: Payer: Self-pay

## 2020-12-30 DIAGNOSIS — M546 Pain in thoracic spine: Secondary | ICD-10-CM

## 2020-12-30 DIAGNOSIS — M6281 Muscle weakness (generalized): Secondary | ICD-10-CM

## 2020-12-30 NOTE — Therapy (Signed)
[facility] [address] [place], [place], [address] Phone: [phone]   Fax:  [phone]  Physical Therapy Treatment  Patient Details  Name: Heidi Petersen MRN: [id number] Date of Birth: [birth year] Referring Provider (PT): Billey Co, MD   Encounter Date: 12/30/2020   PT End of Session - 12/30/20 1710     Visit Number 3    Number of Visits 16    Date for PT Re-Evaluation 02/12/21    Authorization Type [employer]    PT Start Time 0510   pt arrived late   PT Stop Time 0540    PT Time Calculation (min) 30 min             Past Medical History:  Diagnosis Date   Asthma    Prematurity    Sleep apnea     Past Surgical History:  Procedure Laterality Date   ADENOIDECTOMY     TONSILLECTOMY      There were no vitals filed for this visit.   Subjective Assessment - 12/30/20 1713     Subjective Pt reports that she is having less frequent pain.  She is having 3/10 R Ut pain currently.    Pertinent History Significant PMH: asthma             TREATMENT 12/30/2020:  Therapeutic Exercise: - UBE 2'/2' fwd and backward for warm up while taking subjective - Row - black TB - 3x10 - Shoulder ext - green TB - 3x10 - bil ER with GTB - 3x10 - KB bottom up OH press - 5# - 3x10  Manual Therapy: - skilled palpation for TDN  Trigger Point Dry Needling Treatment: Pre-treatment instruction: Patient instructed on dry needling rationale, procedures, and possible side effects including pain during treatment (achy,cramping feeling), bruising, drop of blood, lightheadedness, nausea, sweating. Patient Consent Given: Yes Education handout provided: No Muscles treated: R UT, R sub scap, Thoracic multifidus @ T5  Treatment response/outcome: Twitch response elicited Post-treatment instructions: Patient instructed to expect possible mild to moderate muscle soreness later today and/or tomorrow. Patient instructed in methods  to reduce muscle soreness and to continue prescribed HEP. If patient was dry needled over the lung field, patient was instructed on signs and symptoms of pneumothorax and, however unlikely, to see immediate medical attention should they occur. Patient was also educated on signs and symptoms of infection and to seek medical attention should they occur. Patient verbalized understanding of these instructions and education.      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  Pt reports no increase in baseline pain following therapy.  Today we concentrated on cervico-scapulothoracic strengthening and pain reduction.  Pt responds well to TDN with significant reduction in R UT pain.  She shows significant weakness in L shoulder press vs R with instability while holding KB.  Pt will continue to benefit from skilled physical therapy to address remaining deficits and achieve listed goals.  Continue per POC.    Stability/Clinical Decision Making Stable/Uncomplicated    Rehab Potential Good    PT Frequency 2x / week    PT Duration 8 weeks    PT  Treatment/Interventions ADLs/Self Care Home Management;Aquatic Therapy;Electrical Stimulation;Iontophoresis 4mg /ml Dexamethasone;Therapeutic activities;Therapeutic exercise;Neuromuscular re-education;Manual techniques;Dry needling;Taping;Spinal Manipulations    PT Next Visit Plan monitor effect of TDN (needle tx multifidus and sub scap),  Tx manip,  periscapular strengthening (concentration on lower traps), scapular motor control    PT Home Exercise Plan [id number]             Patient will benefit from skilled therapeutic intervention in order to improve the following deficits and impairments:  Pain, Decreased strength  Visit Diagnosis: Muscle weakness  Pain in thoracic spine     Problem List Patient Active Problem List   Diagnosis Date Noted   Right shoulder pain 12/02/2020   Hypopigmented skin lesion 11/21/2019   GAD (generalized anxiety disorder) 08/27/2019   Mild intermittent asthma 08/27/2019    08/29/2019, PT 12/30/2020, 6:02 PM  [facility] [address] [place], [place], [place] Phone: [phone]   Fax:  [phone]  Name: Heidi Petersen MRN: Heidi Petersen Date of Birth: 2000-05-02

## 2021-01-01 ENCOUNTER — Ambulatory Visit: Payer: Medicaid Other | Admitting: Physical Therapy

## 2021-01-01 ENCOUNTER — Other Ambulatory Visit: Payer: Self-pay

## 2021-01-01 ENCOUNTER — Encounter: Payer: Self-pay | Admitting: Physical Therapy

## 2021-01-01 DIAGNOSIS — M6281 Muscle weakness (generalized): Secondary | ICD-10-CM

## 2021-01-01 DIAGNOSIS — M546 Pain in thoracic spine: Secondary | ICD-10-CM | POA: Diagnosis not present

## 2021-01-01 NOTE — Therapy (Signed)
[facility] [address] [place], [place], [address] Phone: [phone]   Fax:  [phone]  Physical Therapy Treatment  Patient Details  Name: Heidi Petersen MRN: [id number] Date of Birth: [birth year] Referring Provider (PT): Billey Co, MD   Encounter Date: 01/01/2021   PT End of Session - 01/01/21 1230     Visit Number 4    Number of Visits 16   pt arrived late   Date for PT Re-Evaluation 02/12/21    Authorization Type Approved 14 PT visits from 12/23/2020-02/21/2021    PT Start Time 1230    PT Stop Time 1255    PT Time Calculation (min) 25 min             Past Medical History:  Diagnosis Date   Asthma    Prematurity    Sleep apnea     Past Surgical History:  Procedure Laterality Date   ADENOIDECTOMY     TONSILLECTOMY      There were no vitals filed for this visit.   Subjective Assessment - 01/01/21 1234     Subjective Pt reports that overall she is improving.  She was having some pain with prone Y at home, but this only occurs during the exercise.  0/10 pain currently    Pertinent History Significant PMH: asthma             Treatment  Therapeutic Exercise: - UBE 2'/2' fwd and backward for warm up while taking subjective - bil ER with GTB - 3x10 (NT) - bird dog - 10x 5'' hold - prone T, Y on table 10x - IR with black TB - 2x10 - Foam roller scapular retraction/protraction and horizontal abduction - quadruped rotation - 10x ea - KB bottom up OH press - 5# - 2x10     PT Short Term Goals - 12/30/20 1801       PT SHORT TERM GOAL #1   Status Achieved               PT Long Term Goals - 12/18/20 0900       PT LONG TERM GOAL #1   Title Target date for all LTGs: 02/11/2021      PT LONG TERM GOAL #2   Title Heidi Petersen will report >/= 50% decrease in pain from evaluation    EVAL: 10/10 max pain    Status New      PT LONG TERM GOAL #3   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  Pt reports no increase in baseline pain following therapy.  Today we concentrated on periscapular strengthening and thoracic mobility.  Pt is progressing mid and lower trap strength as expected with gradually reducing scapular pain.  Pt  will continue to benefit from skilled physical therapy to address remaining deficits and achieve listed goals.  Continue per POC.    Stability/Clinical Decision Making Stable/Uncomplicated    Rehab Potential Good    PT Frequency 2x / week    PT Duration 8 weeks    PT Treatment/Interventions ADLs/Self Care Home Management;Aquatic Therapy;Electrical Stimulation;Iontophoresis 4mg /ml Dexamethasone;Therapeutic activities;Therapeutic exercise;Neuromuscular re-education;Manual techniques;Dry needling;Taping;Spinal Manipulations    PT Next Visit Plan monitor effect of TDN (needle tx multifidus and sub scap),  Tx manip,  periscapular strengthening (concentration on lower traps), scapular motor control    PT Home Exercise Plan [email or web address]             Patient will benefit from skilled therapeutic intervention in order to improve the following deficits and impairments:  Pain, Decreased strength  Visit Diagnosis: Muscle weakness  Pain in thoracic spine     Problem List Patient Active Problem List   Diagnosis Date Noted   Right shoulder pain 12/02/2020   Hypopigmented skin lesion 11/21/2019   GAD (generalized anxiety disorder) 08/27/2019   Mild intermittent asthma  08/27/2019    08/29/2019, PT 01/01/2021, 1:08 PM  [facility] [address] [place], [place], [place] Phone: [phone]   Fax:  [phone]  Name: Heidi Petersen MRN: [name] Date of Birth: 13-May-2000

## 2021-01-08 ENCOUNTER — Other Ambulatory Visit: Payer: Self-pay

## 2021-01-08 ENCOUNTER — Ambulatory Visit: Payer: Medicaid Other | Admitting: Physical Therapy

## 2021-01-08 ENCOUNTER — Encounter: Payer: Self-pay | Admitting: Physical Therapy

## 2021-01-08 DIAGNOSIS — M546 Pain in thoracic spine: Secondary | ICD-10-CM

## 2021-01-08 DIAGNOSIS — M6281 Muscle weakness (generalized): Secondary | ICD-10-CM

## 2021-01-08 NOTE — Therapy (Signed)
[facility] Outpatient [facility] [address] [place], [place], [address] Phone: [phone]   Fax:  [phone]  Physical Therapy Treatment  Patient Details  Name: Heidi Petersen MRN: [id number] Date of Birth: 02/24/2000 Referring Provider (PT): Heidi Co, MD   Encounter Date: 01/08/2021   PT End of Session - 01/08/21 1224     Visit Number 5    Number of Visits 16    Date for PT Re-Evaluation 02/12/21    Authorization Type Approved 14 PT visits from 12/23/2020-02/21/2021    PT Start Time 1224   pt arrived late   PT Stop Time 1255    PT Time Calculation (min) 31 min             Past Medical History:  Diagnosis Date   Asthma    Prematurity    Sleep apnea     Past Surgical History:  Procedure Laterality Date   ADENOIDECTOMY     TONSILLECTOMY      There were no vitals filed for this visit.   Subjective Assessment - 01/08/21 1227     Subjective Pt reports that she has some pain with prone T, but that this resolves with about 20 min of rest.  0/10 pain currently    Pertinent History Significant PMH: asthma             Treatment   Therapeutic Exercise: - UBE 2'/2' fwd and backward for warm up while taking subjective - bil ER with GTB - 3x10 (NT) - bird dog - 10x 10'' hold - prone W table 10x 5'' hold - IR with black TB - 3x10 - Global ext pball roll up - wall - 20x  - w/ lower trap setting 2x10 ea - quadruped rotation - 15x ea - SA pushup - from knees - 3x15 - Farmer's carry bil 15# - 360' - Chest press - 2x10 @ 20#     PT Short Term Goals - 12/30/20 1801       PT SHORT TERM GOAL #1   Status Achieved               PT Long Term Goals - 12/18/20 0900       PT LONG TERM GOAL #1   Title Target date for all LTGs: 02/11/2021      PT LONG TERM GOAL #2   Title Heidi Petersen will report >/= 50% decrease in pain from evaluation    EVAL: 10/10 max pain    Status New      PT LONG TERM GOAL #3   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  Pt reports no increase in baseline pain following therapy.  Today we concentrated on cervico-scapulothoracic strengthening.  Pt shows  consistent periscapular strength progression.  She has significant instability with bird dog on R knee and L shoulder base.  Pt will continue to benefit from skilled physical therapy to address remaining deficits and achieve listed goals.  Continue per POC.    Stability/Clinical Decision Making Stable/Uncomplicated    Rehab Potential Good    PT Frequency 2x / week    PT Duration 8 weeks    PT Treatment/Interventions ADLs/Self Care Home Management;Aquatic Therapy;Electrical Stimulation;Iontophoresis 4mg /ml Dexamethasone;Therapeutic activities;Therapeutic exercise;Neuromuscular re-education;Manual techniques;Dry needling;Taping;Spinal Manipulations    PT Next Visit Plan monitor effect of TDN (needle tx multifidus and sub scap),  Tx manip,  periscapular strengthening (concentration on lower traps), scapular motor control    PT Home Exercise Plan [id number]             Patient will benefit from skilled therapeutic intervention in order to improve the following deficits and impairments:  Pain, Decreased strength  Visit Diagnosis: Muscle weakness  Pain in thoracic spine     Problem List Patient Active Problem List   Diagnosis Date Noted   Right shoulder pain 12/02/2020   Hypopigmented skin lesion 11/21/2019   GAD (generalized anxiety  disorder) 08/27/2019   Mild intermittent asthma 08/27/2019    08/29/2019, PT 01/08/2021, 1:03 PM  [facility] [address] [place], [place], [place] Phone: [phone]   Fax:  [phone]  Name: Heidi Petersen MRN: Heidi Petersen Date of Birth: 2000/11/29

## 2021-01-13 ENCOUNTER — Other Ambulatory Visit: Payer: Self-pay

## 2021-01-13 ENCOUNTER — Ambulatory Visit: Payer: Medicaid Other | Attending: Family Medicine | Admitting: Physical Therapy

## 2021-01-13 DIAGNOSIS — M6281 Muscle weakness (generalized): Secondary | ICD-10-CM | POA: Diagnosis present

## 2021-01-13 DIAGNOSIS — M546 Pain in thoracic spine: Secondary | ICD-10-CM | POA: Diagnosis present

## 2021-01-13 NOTE — Therapy (Signed)
[place], [place], [address] Phone: [phone]   Fax:  [phone]  Physical Therapy Treatment  Patient Details  Name: Heidi Petersen MRN: [id number] Date of Birth: 05/07/2000 Referring Provider (PT): Lenoria Chime, MD   Encounter Date: 01/13/2021   PT End of Session - 01/13/21 1618     Visit Number 6    Number of Visits 16    Date for PT Re-Evaluation 02/12/21    Authorization Type Approved 14 PT visits from 12/23/2020-02/21/2021    PT Start Time [id number]    PT Stop Time [id number]    PT Time Calculation (min) 43 min             Past Medical History:  Diagnosis Date   Asthma    Prematurity    Sleep apnea     Past Surgical History:  Procedure Laterality Date   ADENOIDECTOMY     TONSILLECTOMY      There were no vitals filed for this visit.   Subjective Assessment - 01/13/21 1621     Subjective Pt reports that she continues to improve but has some discomfort in her rhomboid region.  0/10 pain currently    Pertinent History Significant PMH: asthma             Treatment   Therapeutic Exercise: - UBE 2'/2' fwd and backward for warm up while taking subjective - bird dog - 10x 10'' hold - prone T and Y on table - 10'' x5 - IR with black TB - 3x10 (NT) - OH press - 6# - 3x10  - Global ext pball roll up - wall - 20x             - w/ lower trap setting 2x10 ea - quadruped rotation - 15x ea - Face pull - 2x10 RTB - lat pull down - 20# - 2x10 - hevy cues for form - SA pushup - from toes - 3x10 - Farmer's carry bil 20# - 360' - Chest press - 2x10 @ 25#   PT Short Term Goals - 12/30/20 1801       PT SHORT TERM GOAL #1   Status Achieved               PT Long Term Goals - 12/18/20 0900       PT LONG TERM GOAL #1   Title Target date for all LTGs: 02/11/2021      PT LONG TERM GOAL #2   Title Lionel will report >/= 50% decrease in pain from evaluation    EVAL: 10/10 max pain     Status New      PT LONG TERM GOAL #3   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 will improve the following MMTs to >/= 4/5 to show improvement in strength:  bil lower traps    EVAL: 3+/5    Status New      PT LONG TERM GOAL #5   Title Tisa will be able to self manage sxs    EVAL: unable to manage currently    Status New                   Plan - 01/13/21 1657     Clinical Impression Statement Coda is progressing  well with therapy.  Pt reports no increase in baseline pain following therapy.  Today we concentrated on periscapular strengthening and thoracic mobility.  Pt continues to progress periscapular strengthening as expected.  She does require significant cuing for sequencing with lat pull down which we will need to to continue to work on.  Pt will continue to benefit from skilled physical therapy to address remaining deficits and achieve listed goals.  Continue per POC.    Stability/Clinical Decision Making Stable/Uncomplicated    Rehab Potential Good    PT Frequency 2x / week    PT Duration 8 weeks    PT Treatment/Interventions ADLs/Self Care Home Management;Aquatic Therapy;Electrical Stimulation;Iontophoresis 4mg /ml Dexamethasone;Therapeutic activities;Therapeutic exercise;Neuromuscular re-education;Manual techniques;Dry needling;Taping;Spinal Manipulations    PT Next Visit Plan monitor effect of TDN (needle tx multifidus and sub scap),  Tx manip,  periscapular strengthening (concentration on lower traps), scapular motor control    PT Home Exercise Plan [id number]             Patient will benefit from skilled therapeutic intervention in order to improve the following deficits and impairments:  Pain, Decreased strength  Visit Diagnosis: Muscle weakness  Pain in thoracic spine     Problem List Patient Active Problem List   Diagnosis  Date Noted   Right shoulder pain 12/02/2020   Hypopigmented skin lesion 11/21/2019   GAD (generalized anxiety disorder) 08/27/2019   Mild intermittent asthma 08/27/2019    Mathis Dad, PT 01/13/2021, 4:59 PM  [facility] [address] [place], [place], [address] Phone: [phone]   Fax:  [phone]  Name: Heidi Petersen MRN: [id number] Date of Birth: 07/11/2000

## 2021-01-15 ENCOUNTER — Ambulatory Visit: Payer: Medicaid Other

## 2021-01-15 ENCOUNTER — Ambulatory Visit: Payer: Medicaid Other | Admitting: Physical Therapy

## 2021-01-20 ENCOUNTER — Ambulatory Visit: Payer: Medicaid Other | Admitting: Physical Therapy

## 2021-01-22 ENCOUNTER — Encounter: Payer: Medicaid Other | Admitting: Physical Therapy

## 2021-01-22 ENCOUNTER — Ambulatory Visit: Payer: Medicaid Other | Admitting: Physical Therapy

## 2021-01-29 ENCOUNTER — Ambulatory Visit: Payer: Medicaid Other

## 2021-01-30 ENCOUNTER — Other Ambulatory Visit: Payer: Self-pay

## 2021-01-30 ENCOUNTER — Encounter: Payer: Self-pay | Admitting: Physical Therapy

## 2021-01-30 ENCOUNTER — Ambulatory Visit: Payer: Medicaid Other | Admitting: Physical Therapy

## 2021-01-30 DIAGNOSIS — M6281 Muscle weakness (generalized): Secondary | ICD-10-CM

## 2021-01-30 DIAGNOSIS — M546 Pain in thoracic spine: Secondary | ICD-10-CM

## 2021-01-30 NOTE — Therapy (Signed)
[place], [place], [address] Phone: [phone]   Fax:  [phone]  Physical Therapy Treatment  Patient Details  Name: Heidi Petersen MRN: [id number] Date of Birth: 11/07/2000 Referring Provider (PT): Lenoria Chime, MD   Encounter Date: 01/30/2021   PT End of Session - 01/30/21 1227     Visit Number 7    Number of Visits 16    Date for PT Re-Evaluation 02/12/21    Authorization Type Approved 14 PT visits from 12/23/2020-02/21/2021    PT Start Time 1227   pt arrived late   PT Stop Time 1257    PT Time Calculation (min) 30 min             Past Medical History:  Diagnosis Date   Asthma    Prematurity    Sleep apnea     Past Surgical History:  Procedure Laterality Date   ADENOIDECTOMY     TONSILLECTOMY      There were no vitals filed for this visit.   Subjective Assessment - 01/30/21 1231     Subjective Pt reports that her back continues to improve.  She has not been able to do her exercises over the last week d/t a busy schedule and has had a few instances of discomfort in her back.  6/10 pain currently in R shoulder    Pertinent History Significant PMH: asthma              Treatment   Therapeutic Exercise: - UBE 2'/2' fwd and backward for warm up while taking subjective - prone T and Y on table - 10'' x5 - OH press - 5# - 3x10  - Global ext pball roll up - wall - 20x             - w/ lower trap setting 2x10 ea - quadruped rotation - 20x ea - lat pull down - 25# - 3x10 - hevy cues for form - Shoulder flexion from plank - 4x4 - Farmer's carry bil 20# - 360'  - Chest press - 2x10 @ 25# (not today)    PT Short Term Goals - 12/30/20 1801       PT SHORT TERM GOAL #1   Status Achieved               PT Long Term Goals - 12/18/20 0900       PT LONG TERM GOAL #1   Title Target date for all LTGs: 02/11/2021      PT LONG TERM GOAL #2   Title Kashina will report >/=  50% decrease in pain from evaluation    EVAL: 10/10 max pain    Status New      PT LONG TERM GOAL #3   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  Pt reports no increase in baseline pain following therapy.  Today we concentrated on cervico-scapulothoracic strengthening.  Pt with significant sxs relief with exercise and when HEP is performed.  She has been busy lately and as a result has not maintained HEP.  I reiterated the importance of her HEP and she confirms understanding.  As long as she is able to self manage with HEP until next visit, we will plan on D/C.  Pt will continue to benefit from skilled physical therapy to address remaining deficits and achieve listed goals.  Continue per POC.    Stability/Clinical Decision Making Stable/Uncomplicated    Rehab Potential Good    PT Frequency 2x / week    PT Duration 8 weeks    PT Treatment/Interventions ADLs/Self Care Home Management;Aquatic Therapy;Electrical Stimulation;Iontophoresis 4mg /ml Dexamethasone;Therapeutic activities;Therapeutic exercise;Neuromuscular re-education;Manual techniques;Dry needling;Taping;Spinal Manipulations    PT Next Visit Plan monitor effect of TDN (needle tx multifidus and sub scap),  Tx manip,  periscapular strengthening (concentration on lower traps), scapular motor control    PT Home Exercise Plan [id number]             Patient will benefit from skilled therapeutic intervention in order to improve the following deficits  and impairments:  Pain, Decreased strength  Visit Diagnosis: Muscle weakness  Pain in thoracic spine     Problem List Patient Active Problem List   Diagnosis Date Noted   Right shoulder pain 12/02/2020   Hypopigmented skin lesion 11/21/2019   GAD (generalized anxiety disorder) 08/27/2019   Mild intermittent asthma 08/27/2019    Mathis Dad, PT 01/30/2021, 1:00 PM  [facility] [address] [place], [place], [address] Phone: [phone]   Fax:  [phone]  Name: Heidi Petersen MRN: [id number] Date of Birth: 07-29-2000

## 2021-02-05 ENCOUNTER — Ambulatory Visit: Payer: Medicaid Other | Admitting: Physical Therapy

## 2021-06-16 ENCOUNTER — Encounter: Payer: Self-pay | Admitting: *Deleted

## 2023-02-20 ENCOUNTER — Telehealth: Payer: Medicaid Other | Admitting: Urgent Care

## 2023-02-20 DIAGNOSIS — M25571 Pain in right ankle and joints of right foot: Secondary | ICD-10-CM | POA: Diagnosis not present

## 2023-02-20 NOTE — Progress Notes (Signed)
 Virtual Visit Consent   Heidi Petersen, you are scheduled for a virtual visit with a [facility] provider today. Just as with appointments in the office, your consent must be obtained to participate. Your consent will be active for this visit and any virtual visit you may have with one of our providers in the next 365 days. If you have a MyChart account, a copy of this consent can be sent to you electronically.  As this is a virtual visit, video technology does not allow for your provider to perform a traditional examination. This may limit your provider's ability to fully assess your condition. If your provider identifies any concerns that need to be evaluated in person or the need to arrange testing (such as labs, EKG, etc.), we will make arrangements to do so. Although advances in technology are sophisticated, we cannot ensure that it will always work on either your end or our end. If the connection with a video visit is poor, the visit may have to be switched to a telephone visit. With either a video or telephone visit, we are not always able to ensure that we have a secure connection.  By engaging in this virtual visit, you consent to the provision of healthcare and authorize for your insurance to be billed (if applicable) for the services provided during this visit. Depending on your insurance coverage, you may receive a charge related to this service.  I need to obtain your verbal consent now. Are you willing to proceed with your visit today? Heidi Petersen has provided verbal consent on 02/20/2023 for a virtual visit (video or telephone). Benton LITTIE Gave, [place]  Date: 02/20/2023 8:46 PM  Virtual Visit via Video Note   I, Benton LITTIE Gave, connected with  Heidi Petersen  ([id number], Mar 16, 2000) on 02/20/23 at  8:30 PM EST by a video-enabled telemedicine application and verified that I am speaking with the correct person using two identifiers.  Location: Patient: Virtual Visit Location Patient:  Home Provider: Virtual Visit Location Provider: Home Office   I discussed the limitations of evaluation and management by telemedicine and the availability of in person appointments. The patient expressed understanding and agreed to proceed.    History of Present Illness: Heidi Petersen is a 23 y.o. who identifies as a female is being seen today for ankle pain.  HPI: Pleasant 23 year old female presents today due to concerns of right ankle pain.  She states about a week ago she accidentally tripped and fell down several stairs.  She reports that her lateral right ankle was uncomfortable and swollen after the fall.  She has been icing it, elevating it, and taking anti-inflammatories.  She states the pain is bearable and has been improving since the initial fall.  She is currently in a 4 out of 10 pain.  Her biggest concern however is the amount of bruising that is still present.  She states she is fully ambulatory and able to weight-bear, but does not like the appearance of it.  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  She denies sensation loss.  Pain only to the lateral malleolus area, nothing in the lower leg or foot.    Problems:  Patient Active Problem List   Diagnosis Date Noted   Right shoulder pain 12/02/2020   Hypopigmented skin lesion 11/21/2019   GAD (generalized anxiety disorder) 08/27/2019   Mild intermittent asthma 08/27/2019    Allergies:  Allergies  Allergen Reactions   Augmentin [Amoxicillin-Pot  Clavulanate] Itching   Palgic [Carbinoxamine] Itching and Other (See Comments)   Medications:  Current Outpatient Medications:    albuterol  (PROVENTIL  HFA;VENTOLIN  HFA) 108 (90 Base) MCG/ACT inhaler, Inhale 1-2 puffs into the lungs every 6 (six) hours as needed for wheezing or shortness of breath., Disp: 1 Inhaler, Rfl: 0   fluticasone  (FLONASE ) 50 MCG/ACT nasal spray, Place 1 spray into both nostrils daily. 1 spray in each nostril every day, Disp: 16  g, Rfl: 2   naproxen  (NAPROSYN ) 500 MG tablet, TAKE 1 TABLET(500 MG) BY MOUTH TWICE DAILY WITH A MEAL, Disp: 14 tablet, Rfl: 0  Observations/Objective: Patient is well-developed, well-nourished in no acute distress.  Resting comfortably at home.  Head is normocephalic, atraumatic.  No labored breathing.  Speech is clear and coherent with logical content.  Patient is alert and oriented at baseline.  R ankle shows a moderate degree of bruising around the lateral R malleolus with moderate swelling. FROM to plantar and dorsiflexion.  Assessment and Plan: 1. Acute right ankle pain (Primary)  Recommended pt go to UC in the morning for imaging given appearance of the ankle and failure to respond to RICE.   Follow Up Instructions: I discussed the assessment and treatment plan with the patient. The patient was provided an opportunity to ask questions and all were answered. The patient agreed with the plan and demonstrated an understanding of the instructions.  A copy of instructions were sent to the patient via MyChart unless otherwise noted below.    The patient was advised to call back or seek an in-person evaluation if the symptoms worsen or if the condition fails to improve as anticipated.    Benton LITTIE Gave, PA

## 2023-02-20 NOTE — Patient Instructions (Signed)
  Massie Essex, thank you for joining Benton LITTIE Gave, PA for today's virtual visit.  While this provider is not your primary care provider (PCP), if your PCP is located in our provider database this encounter information will be shared with them immediately following your visit.   A [facility] MyChart account gives you access to today's visit and all your visits, tests, and labs performed at [facility]  click here if you don't have a [facility] MyChart account or go to [email or web address]  Consent: (Patient) Heidi Petersen provided verbal consent for this virtual visit at the beginning of the encounter.  Current Medications:  Current Outpatient Medications:    albuterol  (PROVENTIL  HFA;VENTOLIN  HFA) 108 (90 Base) MCG/ACT inhaler, Inhale 1-2 puffs into the lungs every 6 (six) hours as needed for wheezing or shortness of breath., Disp: 1 Inhaler, Rfl: 0   fluticasone  (FLONASE ) 50 MCG/ACT nasal spray, Place 1 spray into both nostrils daily. 1 spray in each nostril every day, Disp: 16 g, Rfl: 2   naproxen  (NAPROSYN ) 500 MG tablet, TAKE 1 TABLET(500 MG) BY MOUTH TWICE DAILY WITH A MEAL, Disp: 14 tablet, Rfl: 0   Medications ordered in this encounter:  No orders of the defined types were placed in this encounter.    *If you need refills on other medications prior to your next appointment, please contact your pharmacy*  Follow-Up: Call back or seek an in-person evaluation if the symptoms worsen or if the condition fails to improve as anticipated.  [facility] [phone]  Other Instructions Please go to [facility]gent Care or [facility] urgent care in the morning for imaging.  I recommend bracing with a ASO ankle stabilizer or air cast.    If you have been instructed to have an in-person evaluation today at a local Urgent Care facility, please use the link below. It will take you to a list of all of our available [facility], including address, phone number and hours of operation. Please do not delay care.  [facility]  If you or a family member do not have a primary care provider, use the link below to schedule a visit and establish care. When you choose a [facility] primary care physician or advanced practice provider, you gain a long-term partner in health. Find a Primary Care Provider  Learn more about [facility] in-office and virtual care options: [facility] - Get Care Now

## 2023-04-12 NOTE — Therapy (Signed)
 OUTPATIENT PHYSICAL THERAPY LOWER EXTREMITY EVALUATION   Patient Name: Heidi Petersen MRN: [id number] DOB:Dec 14, 2000, 23 y.o., female Today's Date: 04/15/2023  END OF SESSION:  PT End of Session - 04/15/23 1240     Visit Number 1    Number of Visits 13    Date for PT Re-Evaluation 06/03/23    Authorization Type [employer] STATE HE[employer];  [place] MEDICAID [facility]    PT Start Time 1235    PT Stop Time 1315    PT Time Calculation (min) 40 min    Activity Tolerance Patient tolerated treatment well    Behavior During Therapy [facility] for tasks assessed/performed             Past Medical History:  Diagnosis Date   Asthma    Prematurity    Sleep apnea    Past Surgical History:  Procedure Laterality Date   ADENOIDECTOMY     TONSILLECTOMY     Patient Active Problem List   Diagnosis Date Noted   Right shoulder pain 12/02/2020   Hypopigmented skin lesion 11/21/2019   GAD (generalized anxiety disorder) 08/27/2019   Mild intermittent asthma 08/27/2019    PCP: Georg Ruddle Mahnoor, M  REFERRING PROVIDER: Delfin Gant, MD   REFERRING DIAG: [phone] (ICD-10-CM) - Pain in right ankle and joints of right foot   THERAPY DIAG:  Pain in right ankle and joints of right foot  Muscle weakness (generalized)  Difficulty in walking, not elsewhere classified  Rationale for Evaluation and Treatment: Rehabilitation  ONSET DATE: 02/12/23  SUBJECTIVE:   SUBJECTIVE STATEMENT: Pt reports: Injured her R ankle when she twisted it running down steps at work. The pain is improving, but is still experieninces pain and swelling, and her activity level is limited. 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  PERTINENT HISTORY:  PAIN:  Are you having pain? Yes: NPRS scale: 4/10. Pain location: R lateral  Pain description: ache, sharp Aggravating factors:  prolonged walking, teaching, dance, jumping and hopping, light jog Relieving factors: Pain medication, ice pack Pain range the week prior to PT: 0-7/10  PRECAUTIONS: None  RED FLAGS: None   WEIGHT BEARING RESTRICTIONS: No  FALLS:  Has patient fallen in last 6 months? Yes. 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   PLOF: Independent  PATIENT GOALS: Return to dance and functioning better  NEXT MD VISIT: 05/16/23  OBJECTIVE:  Note: Objective measures were completed at Evaluation unless otherwise noted.  DIAGNOSTIC FINDINGS: Pt reports xray did not show a fx per her MD visit c Dr. Penni Bombard  PATIENT SURVEYS:  LEFS 58/80=70% ability  COGNITION: Overall cognitive status: Within functional limits for tasks assessed     SENSATION: WFL  EDEMA:  Mod swelling of the R lateral ankle  MUSCLE LENGTH: Hamstrings: Right WNLs deg; Left WNLs deg Maisie Fus test: Right NT deg; Left NT deg  POSTURE: No Significant postural limitations  PALPATION: TTP to the medial taol-fibular joint space  LOWER EXTREMITY ROM:  AROM for the R ankle is equal to the L Active ROM Right eval Left eval  Hip flexion  Hip extension    Hip abduction    Hip adduction    Hip internal rotation    Hip external rotation    Knee flexion    Knee extension    Ankle dorsiflexion WNLs   Ankle plantarflexion WNLs   Ankle inversion WNLs p   Ankle eversion WNLs p   P=pain  (Blank rows = not tested)  LOWER EXTREMITY MMT:  MMT Right eval Left eval  Hip flexion    Hip extension    Hip abduction    Hip adduction    Hip internal rotation    Hip external rotation    Knee flexion     Knee extension    Ankle dorsiflexion 5   Ankle plantarflexion 5   Ankle inversion 5   Ankle eversion 5    (Blank rows = not tested)  LOWER EXTREMITY SPECIAL TESTS:  Ankle special tests: Anterior drawer test: negative  FUNCTIONAL TESTS:  Single leg standing balance TBA Single leg hop TBA when pain and swelling is decrease  GAIT: Distance walked: 200' Assistive device utilized: None Level of assistance: Complete Independence Comments: WNLs                                                                                                                                TREATMENT DATE:  [facility] Adult PT Treatment:                                                DATE: 04/15/23 Therapeutic Exercise: Developed, instructed in, and pt completed therex as noted in HEP  Self Care: RICE periodically for pain and swelling management  PATIENT EDUCATION:  Education details: Eval findings, POC, HEP, self care  Person educated: Patient Education method: Explanation, Demonstration, Tactile cues, Verbal cues, and Handouts Education comprehension: verbalized understanding, returned demonstration, verbal cues required, and tactile cues required  HOME EXERCISE PROGRAM: Access Code: [email or web address] URL: [email or web address] Date: 04/15/2023 Prepared by: Joellyn Rued  Exercises - Long Sitting Ankle Plantar Flexion with Resistance  - 1 x daily - 7 x weekly - 3 sets - 10 reps - 2 hold - Long Sitting Ankle Eversion with Resistance  - 1 x daily - 7 x weekly - 3 sets - 10 reps - 2 hold - Long Sitting Ankle Inversion with Resistance  - 1 x daily - 7 x weekly - 3 sets - 10 reps - 2 hold - Seated Ankle Dorsiflexion with Resistance  - 1 x daily - 7 x weekly - 3 sets - 10 reps - 2 hold - Heel Toe Raises with Counter Support  - 1 x daily - 7 x weekly - 3 sets - 10 reps - 2 hold - Standing Single Leg Stance with Counter Support  - 1 x daily - 7 x weekly - 1  sets - 5 reps - 30  hold  ASSESSMENT:  CLINICAL IMPRESSION: Patient is a 23 y.o. female who was seen today for physical therapy evaluation and treatment for M25.571 (ICD-10-CM) - Pain in right ankle and joints of right foot. Pt presents to PT 2 months after injuring her R ankle. Pt's signs and symptoms are consistent with a lateral ankle sprain/strain. At this point, ROM and strength are good, but pt is still experiencing swelling and decreased ability to complete higher levels of function with the R ankle due to pain. R ankle stability appears WNLs. A HEP was initiated. Pt will benefit from skilled PT to address impairments to optimize R ankle/foot function with less pain.   OBJECTIVE IMPAIRMENTS: decreased activity tolerance, difficulty walking, decreased strength, and pain.   ACTIVITY LIMITATIONS: bending, squatting, locomotion level, and jumping  PARTICIPATION LIMITATIONS:  Recreational performance dance   PERSONAL FACTORS: Past/current experiences and Time since onset of injury/illness/exacerbation are also affecting patient's functional outcome.   REHAB POTENTIAL: Excellent  CLINICAL DECISION MAKING: Stable/uncomplicated  EVALUATION COMPLEXITY: Low   GOALS:  SHORT TERM GOALS = LTGs  LONG TERM GOALS: Target date: 06/03/22  Pt will be Ind in a final HEP to maintain achieved LOF  Baseline: started Goal status: INITIAL  2.  Pt will report 75% or greater improvement in R ankle/foot pain with basic dance steps Baseline:  Goal status: INITIAL  3.  Pt will demonstrate single leg balance of the R LE with 90% of the L LE for improved function Baseline: TBA Goal status: INITIAL  4.  Pt will demonstrate single leg PF of the R LE within 90% of the L LE for improved funtion Baseline: TBA Goal status: INITIAL  5.  Pt will demonstrated a single leg hop with the R LE within 90% of the L LE as indication of ability to return to recreational performance dance Baseline: Assess when pt is able to  demonstrate small range single leg plyometric jumps Goal status: INITIAL   PLAN:  PT FREQUENCY: 2x/week  PT DURATION: 6 weeks  PLANNED INTERVENTIONS: 97164- PT Re-evaluation, 97110-Therapeutic exercises, 97530- Therapeutic activity, 97112- Neuromuscular re-education, 97535- Self Care, 16109- Manual therapy, 97116- Gait training, 97016- Vasopneumatic device, Patient/Family education, Balance training, Stair training, Taping, Dry Needling, Joint mobilization, Cryotherapy, and Moist heat  PLAN FOR NEXT SESSION: Assess response to HEP; progress therex as indicated; use of modalities, manual therapy; and TPDN as indicated.   Jalyn Dutta MS, PT 04/17/23 10:48 PM

## 2023-04-15 ENCOUNTER — Ambulatory Visit: Attending: Sports Medicine

## 2023-04-15 ENCOUNTER — Other Ambulatory Visit: Payer: Self-pay

## 2023-04-15 DIAGNOSIS — R262 Difficulty in walking, not elsewhere classified: Secondary | ICD-10-CM | POA: Insufficient documentation

## 2023-04-15 DIAGNOSIS — M6281 Muscle weakness (generalized): Secondary | ICD-10-CM | POA: Diagnosis present

## 2023-04-15 DIAGNOSIS — M25571 Pain in right ankle and joints of right foot: Secondary | ICD-10-CM | POA: Diagnosis present

## 2023-04-25 ENCOUNTER — Ambulatory Visit (INDEPENDENT_AMBULATORY_CARE_PROVIDER_SITE_OTHER): Admitting: Family Medicine

## 2023-04-25 ENCOUNTER — Encounter: Payer: Self-pay | Admitting: Family Medicine

## 2023-04-25 ENCOUNTER — Other Ambulatory Visit (HOSPITAL_COMMUNITY)
Admission: RE | Admit: 2023-04-25 | Discharge: 2023-04-25 | Disposition: A | Discharge status: Home / Self Care | Source: Ambulatory Visit | Attending: Family Medicine | Admitting: Family Medicine

## 2023-04-25 VITALS — BP 105/75 | HR 70 | Ht 66.0 in | Wt 123.2 lb

## 2023-04-25 DIAGNOSIS — Z124 Encounter for screening for malignant neoplasm of cervix: Secondary | ICD-10-CM

## 2023-04-25 DIAGNOSIS — Z0000 Encounter for general adult medical examination without abnormal findings: Secondary | ICD-10-CM

## 2023-04-25 DIAGNOSIS — R6889 Other general symptoms and signs: Secondary | ICD-10-CM | POA: Diagnosis not present

## 2023-04-25 NOTE — Progress Notes (Signed)
    SUBJECTIVE:   CHIEF COMPLAINT / HPI:   Patient states that she is always cold, frequently shivers, concerned she may be anemic, no dizziness or vision changes  History of asthma, has as needed albuterol inhaler that she does not frequently use History GAD and depression, not medicated, stable Physical activity weekly, she is a Dietitian No known family history of medical diseases  G0. Last Pap normal in 2021.  No new sexual partner since 2023, has not been tested for STDs since most recent partner.  Received first HPV shot, due for second dose  No alcohol, never smoker, no other drug use  PERTINENT  PMH / PSH: Mild intermittent asthma  OBJECTIVE:   BP 105/75   Pulse 70   Ht 5\' 6"  (1.676 m)   Wt 123 lb 3.2 oz (55.9 kg)   LMP 04/22/2023   SpO2 99%   BMI 19.89 kg/m    GEN: Well-appearing, no acute distress CV: Regular rate and rhythm, no murmurs Respiratory: CTAB, normal work of breathing on room air Pelvic - CMA Dayshia present. VULVA: normal appearing vulva with no masses, tenderness or lesions  VAGINA: normal appearing vagina with normal color and discharge, no lesions  CERVIX: normal appearing cervix without discharge or lesions, no CMT, cervix retroverted  Thin prep pap is done, HPV if + ASCUS  ASSESSMENT/PLAN:   Assessment & Plan Cold feeling Differentials include anemia, thyroid abnormality, will check baseline lab work today Encounter for general adult medical examination without abnormal findings We will obtain lipid panel, hepatitis C screening, HIV screening today Encounter for Papanicolaou smear of cervix Pap completed today with STD testing   Follow-up in 1 year  Sarahann Cumins, DO [facility]

## 2023-04-25 NOTE — Patient Instructions (Signed)
 Good to see you today - Thank you for coming in  Things we discussed today: We completed your Pap today and tested you for STDs.  We also got some lab work including your blood counts, cholesterol, thyroid function.  I will let you know your results via MyChart.   Please follow-up with your PCP in 1 year for general health maintenance

## 2023-04-26 LAB — TSH RFX ON ABNORMAL TO FREE T4: TSH: 1.93 u[IU]/mL (ref 0.450–4.500)

## 2023-04-26 LAB — LIPID PANEL
Chol/HDL Ratio: 2.5 ratio (ref 0.0–4.4)
Cholesterol, Total: 163 mg/dL (ref 100–199)
HDL: 65 mg/dL (ref >39)
LDL Chol Calc (NIH): 86 mg/dL (ref 0–99)
Triglycerides: 59 mg/dL (ref 0–149)
VLDL Cholesterol Cal: 12 mg/dL (ref 5–40)

## 2023-04-26 LAB — CBC
Hematocrit: 38.6 % (ref 34.0–46.6)
Hemoglobin: 12.9 g/dL (ref 11.1–15.9)
MCH: 30.2 pg (ref 26.6–33.0)
MCHC: 33.4 g/dL (ref 31.5–35.7)
MCV: 90 fL (ref 79–97)
Platelets: 319 10*3/uL (ref 150–450)
RBC: 4.27 x10E6/uL (ref 3.77–5.28)
RDW: 13.1 % (ref 11.7–15.4)
WBC: 5.3 10*3/uL (ref 3.4–10.8)

## 2023-04-26 LAB — HIV ANTIBODY (ROUTINE TESTING W REFLEX): HIV Screen 4th Generation wRfx: NONREACTIVE

## 2023-04-26 LAB — RPR: RPR Ser Ql: NONREACTIVE

## 2023-04-26 NOTE — Therapy (Signed)
 OUTPATIENT PHYSICAL THERAPY LOWER EXTREMITY TREATMENT   Patient Name: Heidi Petersen MRN: [id number] DOB:September 14, 2000, 23 y.o., female Today's Date: 04/27/2023  END OF SESSION:  PT End of Session - 04/27/23 0824     Visit Number 2    Number of Visits 13    Date for PT Re-Evaluation 06/03/23    Authorization Type [employer];  [place] MEDICAID [facility]    PT Start Time 0809    PT Stop Time [phone]    PT Time Calculation (min) 38 min    Activity Tolerance Patient tolerated treatment well    Behavior During Therapy [facility] for tasks assessed/performed              Past Medical History:  Diagnosis Date   Asthma    Prematurity    Sleep apnea    Past Surgical History:  Procedure Laterality Date   ADENOIDECTOMY     TONSILLECTOMY     WISDOM TOOTH EXTRACTION     Patient Active Problem List   Diagnosis Date Noted   Right shoulder pain 12/02/2020   Hypopigmented skin lesion 11/21/2019   GAD (generalized anxiety disorder) 08/27/2019   Mild intermittent asthma 08/27/2019    PCP: Derril Flint Mahnoor, M  REFERRING PROVIDER: Stafford Eagles, MD   REFERRING DIAG: [phone] (ICD-10-CM) - Pain in right ankle and joints of right foot   THERAPY DIAG:  Pain in right ankle and joints of right foot  Muscle weakness (generalized)  Difficulty in walking, not elsewhere classified  Rationale for Evaluation and Treatment: Rehabilitation  ONSET DATE: 02/12/23  SUBJECTIVE:   SUBJECTIVE STATEMENT: R ankle is improving. Was able to run a short distance in the ankle brace with less pain. Has more confidence with teching dance. Pt reports being less consistent with HEP recently.  EVAL: Pt reports: Injured her R ankle when she twisted it running down steps at work. The pain is improving, but is still experieninces pain and swelling, and her activity level is limited. 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 Pt is wearing a ASO brace when teaching dance,  and is currently not performance dancing with the ankle injury.  PERTINENT HISTORY:  PAIN:  Are you having pain? Yes: NPRS scale: 3/10. Pain location: R lateral  Pain description: ache, sharp Aggravating factors: prolonged walking, teaching, dance, jumping and hopping, light jog Relieving factors: Pain medication, ice pack Pain range the week prior to PT: 0-7/10  PRECAUTIONS: None  RED FLAGS: None   WEIGHT BEARING RESTRICTIONS: No  FALLS:  Has patient fallen in last 6 months? Yes. 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   PLOF: Independent  PATIENT GOALS: Return to dance and functioning better  NEXT MD VISIT: 05/16/23  OBJECTIVE:  Note: Objective measures were completed at Evaluation unless otherwise noted.  DIAGNOSTIC FINDINGS: Pt reports xray did not show a fx per her MD visit c Dr. Jacqulyne Maxim  PATIENT SURVEYS:  LEFS 58/80=70% ability  COGNITION: Overall cognitive status: Within functional limits for tasks assessed     SENSATION: WFL  EDEMA:  Mod swelling of the R lateral ankle  MUSCLE LENGTH: Hamstrings: Right WNLs deg; Left WNLs deg Andy Bannister test: Right NT deg;  Left NT deg  POSTURE: No Significant postural limitations  PALPATION: TTP to the medial taol-fibular joint space  LOWER EXTREMITY ROM:  AROM for the R ankle is equal to the L Active ROM Right eval Left eval  Hip flexion    Hip extension    Hip abduction    Hip adduction    Hip internal rotation    Hip external rotation    Knee flexion    Knee extension    Ankle dorsiflexion WNLs   Ankle plantarflexion WNLs   Ankle inversion WNLs p   Ankle eversion WNLs p   P=pain   (Blank rows = not tested)  LOWER EXTREMITY MMT:  MMT Right eval Left eval  Hip flexion    Hip extension    Hip abduction    Hip adduction    Hip internal rotation    Hip external rotation    Knee flexion    Knee extension    Ankle dorsiflexion 5   Ankle plantarflexion 5   Ankle inversion 5   Ankle eversion 5    (Blank rows = not tested)  LOWER EXTREMITY SPECIAL TESTS:  Ankle special tests: Anterior drawer test: negative  FUNCTIONAL TESTS:  Single leg standing balance TBA Single leg hop TBA when pain and swelling is decrease  GAIT: Distance walked: 200' Assistive device utilized: None Level of assistance: Complete Independence Comments: WNLs                                                                                                                  TREATMENT DATE:  [facility] Adult PT Treatment:                                                DATE: 04/27/23 Therapeutic Exercise: Long Sitting Ankle Plantar Flexion 2x10 RTB Long Sitting Ankle Eversion  2x10 RTB Long Sitting Ankle Inversion  2x10 RTB Seated Ankle Dorsiflexion 2x10 RTB Therapeutic Activities: Heel Toe Raises with Counter Support, bilat and SL x10 each Standing Single Leg Stance with Counter Support Banded side step c band at ankle RTB  [facility] Adult PT Treatment:                                                DATE: 04/15/23 Therapeutic Exercise: Developed, instructed in, and pt completed therex as noted in HEP  Self Care: RICE periodically for pain and swelling management  PATIENT EDUCATION:  Education details: Eval findings, POC, HEP, self care  Person educated: Patient Education method: Explanation, Demonstration, Tactile cues, Verbal cues, and Handouts Education comprehension: verbalized understanding, returned demonstration, verbal cues required, and tactile cues required  HOME EXERCISE PROGRAM: Access Code: [email or web address] URL: [email or web address] Date: 04/27/2023 Prepared by: Liborio Reeds  Exercises - Long  Sitting Ankle Plantar Flexion with Resistance  - 1 x daily - 7 x weekly - 3 sets - 10 reps - 2 hold - Long Sitting Ankle Eversion with Resistance  - 1 x daily - 7 x weekly - 3 sets - 10 reps - 2 hold - Long Sitting Ankle Inversion with Resistance  - 1 x daily - 7 x weekly - 3 sets - 10 reps - 2 hold - Seated Ankle Dorsiflexion with Resistance  - 1 x daily - 7 x weekly - 3 sets - 10 reps - 2 hold - Heel Toe Raises with Counter Support  - 1 x daily - 7 x weekly - 3 sets - 10 reps - 2 hold - Standing Single Leg Stance with Counter Support  - 1 x daily - 7 x weekly - 1 sets - 5 reps - 30 hold - Side Stepping with Resistance at Ankles  - 1 x daily - 7 x weekly - 3 sets - 20 reps  ASSESSMENT:  CLINICAL IMPRESSION: PT was completed for R ankle strengthening and balance. Progressions were were added for balance and CKC exercises. Pt completed the exercises properly. Pt was encouraged to complete her HEP consistently. Pt tolerated PT today without adverse effects. Pt will continue to benefit from skilled PT to address impairments for improved R ankle function with minimized pain.  EVAL: Patient is a 23 y.o. female who was seen today for physical therapy evaluation and treatment for M25.571 (ICD-10-CM) - Pain in right ankle and joints of right foot. Pt presents to PT 2 months after injuring her R ankle. Pt's signs and symptoms are consistent with a lateral ankle sprain/strain. At this point, ROM and strength are good, but pt is still experiencing swelling and decreased ability to complete higher levels of function with the R ankle due to pain. R ankle stability appears WNLs. A HEP was initiated. Pt will benefit from skilled PT to address impairments to optimize R ankle/foot function with less pain.   OBJECTIVE IMPAIRMENTS: decreased activity tolerance, difficulty walking, decreased strength, and pain.   ACTIVITY LIMITATIONS: bending, squatting, locomotion level, and  jumping  PARTICIPATION LIMITATIONS:  Recreational performance dance   PERSONAL FACTORS: Past/current experiences and Time since onset of injury/illness/exacerbation are also affecting patient's functional outcome.   REHAB POTENTIAL: Excellent  CLINICAL DECISION MAKING: Stable/uncomplicated  EVALUATION COMPLEXITY: Low   GOALS:  SHORT TERM GOALS = LTGs  LONG TERM GOALS: Target date: 06/03/22  Pt will be Ind in a final HEP to maintain achieved LOF  Baseline: started Goal status: INITIAL  2.  Pt will report 75% or greater improvement in R ankle/foot pain with basic dance steps Baseline:  Goal status: INITIAL  3.  Pt will demonstrate single leg balance of the R LE with 90% of the L LE for improved function Baseline: TBA Goal status: INITIAL  4.  Pt will demonstrate single leg PF of the R LE within 90% of the L LE for improved funtion Baseline: TBA Goal status: INITIAL  5.  Pt will demonstrated a single leg hop with the R LE within 90% of the L LE as indication of ability to return to recreational performance dance Baseline: Assess when pt is able to demonstrate small range single leg plyometric jumps Goal status: INITIAL   PLAN:  PT FREQUENCY: 2x/week  PT DURATION: 6 weeks  PLANNED INTERVENTIONS: 97164- PT Re-evaluation, 97110-Therapeutic exercises, 97530- Therapeutic activity, 97112- Neuromuscular re-education, 97535- Self Care, [id number]- Manual therapy, [id number]- Gait training, [id number]-  Vasopneumatic device, Patient/Family education, Balance training, Stair training, Taping, Dry Needling, Joint mobilization, Cryotherapy, and Moist heat  PLAN FOR NEXT SESSION: Assess response to HEP; progress therex as indicated; use of modalities, manual therapy; and TPDN as indicated.   Keddrick Wyne MS, PT 04/27/23 8:51 AM

## 2023-04-27 ENCOUNTER — Ambulatory Visit

## 2023-04-27 ENCOUNTER — Encounter: Payer: Self-pay | Admitting: Family Medicine

## 2023-04-27 DIAGNOSIS — M6281 Muscle weakness (generalized): Secondary | ICD-10-CM

## 2023-04-27 DIAGNOSIS — M25571 Pain in right ankle and joints of right foot: Secondary | ICD-10-CM

## 2023-04-27 DIAGNOSIS — R262 Difficulty in walking, not elsewhere classified: Secondary | ICD-10-CM

## 2023-04-28 ENCOUNTER — Encounter: Payer: Self-pay | Admitting: Family Medicine

## 2023-04-28 LAB — CYTOLOGY - PAP
Chlamydia: NEGATIVE
Comment: NEGATIVE
Comment: NEGATIVE
Comment: NORMAL
Diagnosis: NEGATIVE
Neisseria Gonorrhea: NEGATIVE
Trichomonas: NEGATIVE

## 2023-05-02 NOTE — Therapy (Signed)
 OUTPATIENT PHYSICAL THERAPY LOWER EXTREMITY TREATMENT   Patient Name: Heidi Petersen MRN: [id number] DOB:2000/11/25, 23 y.o., female Today's Date: 05/04/2023  END OF SESSION:  PT End of Session - 05/04/23 0943     Visit Number 3    Number of Visits 13    Date for PT Re-Evaluation 06/03/23    Authorization Type [employer];  [place] MEDICAID [facility]    PT Start Time [phone]    PT Stop Time 1018    PT Time Calculation (min) 38 min    Activity Tolerance Patient tolerated treatment well    Behavior During Therapy WFL for tasks assessed/performed               Past Medical History:  Diagnosis Date   Asthma    Prematurity    Sleep apnea    Past Surgical History:  Procedure Laterality Date   ADENOIDECTOMY     TONSILLECTOMY     WISDOM TOOTH EXTRACTION     Patient Active Problem List   Diagnosis Date Noted   Right shoulder pain 12/02/2020   Hypopigmented skin lesion 11/21/2019   GAD (generalized anxiety disorder) 08/27/2019   Mild intermittent asthma 08/27/2019    PCP: Derril Flint Mahnoor, M  REFERRING PROVIDER: Stafford Eagles, MD   REFERRING DIAG: [phone] (ICD-10-CM) - Pain in right ankle and joints of right foot   THERAPY DIAG:  Pain in right ankle and joints of right foot  Muscle weakness (generalized)  Difficulty in walking, not elsewhere classified  Rationale for Evaluation and Treatment: Rehabilitation  ONSET DATE: 02/12/23  SUBJECTIVE:   SUBJECTIVE STATEMENT: Pt states the R ankle is bette, 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 She states she has not been able to complete her HEP since the last PT session.  EVAL: Pt reports: Injured her R ankle when she twisted it running down steps at work. The pain is improving, but is still experieninces pain and swelling, and her activity level is limited. 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  PERTINENT HISTORY:  PAIN:  Are you having pain? Yes: NPRS scale: 3/10. Pain location: R lateral  Pain description: ache, sharp Aggravating factors: prolonged walking, teaching, dance, jumping and hopping, light jog Relieving factors: Pain medication, ice pack Pain range the week prior to PT: 0-7/10  PRECAUTIONS: None  RED FLAGS: None   WEIGHT BEARING RESTRICTIONS: No  FALLS:  Has patient fallen in last 6 months? Yes. 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   PLOF: Independent  PATIENT GOALS: Return to dance and functioning better  NEXT MD VISIT: 05/16/23  OBJECTIVE:  Note: Objective measures were completed at Evaluation unless otherwise noted.  DIAGNOSTIC FINDINGS: Pt reports xray did not show a fx per her MD visit c Dr. Jacqulyne Maxim  PATIENT SURVEYS:  LEFS 58/80=70% ability  COGNITION: Overall cognitive status: Within functional limits for tasks assessed     SENSATION: WFL  EDEMA:  Mod swelling of the R lateral ankle  MUSCLE LENGTH: Hamstrings: Right WNLs deg; Left WNLs deg Andy Bannister test: Right NT deg;  Left NT deg  POSTURE: No Significant postural limitations  PALPATION: TTP to the medial taol-fibular joint space  LOWER EXTREMITY ROM:  AROM for the R ankle is equal to the L Active ROM Right eval Left eval  Hip flexion    Hip extension    Hip abduction    Hip adduction    Hip internal rotation    Hip external rotation    Knee flexion    Knee extension    Ankle dorsiflexion WNLs   Ankle plantarflexion WNLs   Ankle inversion WNLs p   Ankle eversion WNLs p   P=pain  (Blank rows = not  tested)  LOWER EXTREMITY MMT:  MMT Right eval Left eval  Hip flexion    Hip extension    Hip abduction    Hip adduction    Hip internal rotation    Hip external rotation    Knee flexion    Knee extension    Ankle dorsiflexion 5   Ankle plantarflexion 5   Ankle inversion 5   Ankle eversion 5    (Blank rows = not tested)  LOWER EXTREMITY SPECIAL TESTS:  Ankle special tests: Anterior drawer test: negative  FUNCTIONAL TESTS:  Single leg standing balance TBA Single leg hop TBA when pain and swelling is decrease  GAIT: Distance walked: 200' Assistive device utilized: None Level of assistance: Complete Independence Comments: WNLs                                                                                                                  TREATMENT DATE:  [facility] Adult PT Treatment:                                                DATE: 05/04/23 Therapeutic Activities: Heel Toe Raises from step, bilat and SL 2x10 each Standing Single Leg Stance with Counter, opp LE knee lifts, semi-circles, abd to knee touch Banded side step c band at ankle RTB SL balance green theraband mat c ball toss Plyometrics- 2 footed forward and lateral hops, SL lateral jumps to skater pose, SL jumps and lands 47" each Rocker board A/P and laterally  [facility] Adult PT Treatment:                                                DATE: 04/27/23 Therapeutic Exercise: Long Sitting Ankle Plantar Flexion 2x10 RTB Long Sitting Ankle Eversion  2x10 RTB Long Sitting Ankle Inversion  2x10 RTB Seated Ankle Dorsiflexion 2x10 RTB Therapeutic Activities: Heel Toe Raises with Counter Support, bilat and SL x10 each Standing Single Leg Stance with Counter Support Banded side step c band at ankle RTB  [facility] Adult PT Treatment:  DATE: 04/15/23 Therapeutic Exercise: Developed, instructed in, and pt completed therex as noted in HEP  Self Care: RICE periodically for pain and  swelling management  PATIENT EDUCATION:  Education details: Eval findings, POC, HEP, self care  Person educated: Patient Education method: Explanation, Demonstration, Tactile cues, Verbal cues, and Handouts Education comprehension: verbalized understanding, returned demonstration, verbal cues required, and tactile cues required  HOME EXERCISE PROGRAM: Access Code: [email or web address] URL: [email or web address] Date: 04/27/2023 Prepared by: Liborio Reeds  Exercises - Long Sitting Ankle Plantar Flexion with Resistance  - 1 x daily - 7 x weekly - 3 sets - 10 reps - 2 hold - Long Sitting Ankle Eversion with Resistance  - 1 x daily - 7 x weekly - 3 sets - 10 reps - 2 hold - Long Sitting Ankle Inversion with Resistance  - 1 x daily - 7 x weekly - 3 sets - 10 reps - 2 hold - Seated Ankle Dorsiflexion with Resistance  - 1 x daily - 7 x weekly - 3 sets - 10 reps - 2 hold - Heel Toe Raises with Counter Support  - 1 x daily - 7 x weekly - 3 sets - 10 reps - 2 hold - Standing Single Leg Stance with Counter Support  - 1 x daily - 7 x weekly - 1 sets - 5 reps - 30 hold - Side Stepping with Resistance at Ankles  - 1 x daily - 7 x weekly - 3 sets - 20 reps  ASSESSMENT:  CLINICAL IMPRESSION: PT focused on CKC strengthening, balance, and plyometrics. Pt demonstrated good single leg use of the RLE with jumps equal to the L and landing without pain. 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 Will re-assess progress at the end of next week with possible DC if the R ankle continues to do as well. Will establish a final HEP.  EVAL: Patient is a 23 y.o. female who was seen today for physical therapy evaluation and treatment for M25.571 (ICD-10-CM) - Pain in right ankle and joints of right foot. Pt presents to PT 2 months after injuring her R ankle. Pt's signs and symptoms are consistent with a lateral ankle sprain/strain. At this point, ROM and strength are  good, but pt is still experiencing swelling and decreased ability to complete higher levels of function with the R ankle due to pain. R ankle stability appears WNLs. A HEP was initiated. Pt will benefit from skilled PT to address impairments to optimize R ankle/foot function with less pain.   OBJECTIVE IMPAIRMENTS: decreased activity tolerance, difficulty walking, decreased strength, and pain.   ACTIVITY LIMITATIONS: bending, squatting, locomotion level, and jumping  PARTICIPATION LIMITATIONS:  Recreational performance dance   PERSONAL FACTORS: Past/current experiences and Time since onset of injury/illness/exacerbation are also affecting patient's functional outcome.   REHAB POTENTIAL: Excellent  CLINICAL DECISION MAKING: Stable/uncomplicated  EVALUATION COMPLEXITY: Low   GOALS:  SHORT TERM GOALS = LTGs  LONG TERM GOALS: Target date: 06/03/22  Pt will be Ind in a final HEP to maintain achieved LOF  Baseline: started Goal status: ONGOING  2.  Pt will report 75% or greater improvement in R ankle/foot pain with basic dance steps Baseline:  Goal status: INITIAL  3.  Pt will demonstrate single leg balance of the R LE with 90% of the L LE for improved function Baseline: TBA Goal status: INITIAL  4.  Pt will demonstrate single leg PF of  the R LE within 90% of the L LE for improved funtion Baseline: TBA Goal status: INITIAL  5.  Pt will demonstrated a single leg hop with the R LE within 90% of the L LE as indication of ability to return to recreational performance dance Baseline: Assess when pt is able to demonstrate small range single leg plyometric jumps 05/04/23: L and R 47" each with jumps/lands without pain Goal status: INITIAL   PLAN:  PT FREQUENCY: 2x/week  PT DURATION: 6 weeks  PLANNED INTERVENTIONS: 97164- PT Re-evaluation, 97110-Therapeutic exercises, 97530- Therapeutic activity, 97112- Neuromuscular re-education, 97535- Self Care, 16109- Manual therapy, 97116-  Gait training, [phone]- Vasopneumatic device, Patient/Family education, Balance training, Stair training, Taping, Dry Needling, Joint mobilization, Cryotherapy, and Moist heat  PLAN FOR NEXT SESSION: Assess response to HEP; progress therex as indicated; use of modalities, manual therapy; and TPDN as indicated.   Fontaine Kossman MS, PT 05/04/23 9:48 PM

## 2023-05-03 ENCOUNTER — Ambulatory Visit: Admitting: Physical Therapy

## 2023-05-03 ENCOUNTER — Telehealth: Payer: Self-pay | Admitting: Physical Therapy

## 2023-05-03 NOTE — Telephone Encounter (Signed)
 Left voicemail for patient regarding no show to appointment. Left next appointment date/time and requested call ahead if there is need to cancel or reschedule.

## 2023-05-04 ENCOUNTER — Ambulatory Visit

## 2023-05-04 DIAGNOSIS — M25571 Pain in right ankle and joints of right foot: Secondary | ICD-10-CM | POA: Diagnosis not present

## 2023-05-04 DIAGNOSIS — M6281 Muscle weakness (generalized): Secondary | ICD-10-CM

## 2023-05-04 DIAGNOSIS — R262 Difficulty in walking, not elsewhere classified: Secondary | ICD-10-CM

## 2023-05-09 ENCOUNTER — Ambulatory Visit: Admitting: Physical Therapy

## 2023-05-12 NOTE — Therapy (Signed)
 OUTPATIENT PHYSICAL THERAPY LOWER EXTREMITY TREATMENT   Patient Name: Heidi Petersen MRN: [id number] DOB:Apr 17, 2000, 23 y.o., female Today's Date: 05/13/2023  END OF SESSION:  PT End of Session - 05/13/23 1115     Visit Number 4    Number of Visits 13    Date for PT Re-Evaluation 06/03/23    Authorization Type [employer];  [place] MEDICAID [facility]    PT Start Time 1114    PT Stop Time 1145    PT Time Calculation (min) 31 min    Activity Tolerance Patient tolerated treatment well    Behavior During Therapy WFL for tasks assessed/performed                Past Medical History:  Diagnosis Date   Asthma    Prematurity    Sleep apnea    Past Surgical History:  Procedure Laterality Date   ADENOIDECTOMY     TONSILLECTOMY     WISDOM TOOTH EXTRACTION     Patient Active Problem List   Diagnosis Date Noted   Right shoulder pain 12/02/2020   Hypopigmented skin lesion 11/21/2019   GAD (generalized anxiety disorder) 08/27/2019   Mild intermittent asthma 08/27/2019    PCP: Derril Flint Mahnoor, M  REFERRING PROVIDER: Stafford Eagles, MD   REFERRING DIAG: [phone] (ICD-10-CM) - Pain in right ankle and joints of right foot   THERAPY DIAG:  Pain in right ankle and joints of right foot  Muscle weakness (generalized)  Difficulty in walking, not elsewhere classified  Rationale for Evaluation and Treatment: Rehabilitation  ONSET DATE: 02/12/23  SUBJECTIVE:   SUBJECTIVE STATEMENT: Pt states her ankle is doing well. Intermittent low pain. 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  EVAL: Pt reports: Injured her R ankle when she twisted it running down steps at work. The pain is improving, but is still experieninces pain and swelling, and her activity level is limited. 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  PERTINENT  HISTORY:  PAIN:  Are you having pain? Yes: NPRS scale: 1/10. Pain location: R lateral  Pain description: ache, sharp Aggravating factors: prolonged walking, teaching, dance, jumping and hopping, light jog Relieving factors: Pain medication, ice pack Pain range the week prior to PT: 0-7/10  PRECAUTIONS: None  RED FLAGS: None   WEIGHT BEARING RESTRICTIONS: No  FALLS:  Has patient fallen in last 6 months? Yes. 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   PLOF: Independent  PATIENT GOALS: Return to dance and functioning better  NEXT MD VISIT: 05/16/23  OBJECTIVE:  Note: Objective measures were completed at Evaluation unless otherwise noted.  DIAGNOSTIC FINDINGS: Pt reports xray did not show a fx per her MD visit c Dr. Jacqulyne Maxim  PATIENT SURVEYS:  LEFS 58/80=70% ability  COGNITION: Overall cognitive status: Within functional limits for tasks assessed     SENSATION: WFL  EDEMA:  Mod swelling of the R lateral ankle  MUSCLE LENGTH: Hamstrings: Right WNLs deg; Left WNLs deg Andy Bannister test: Right NT deg; Left NT deg  POSTURE: No Significant postural limitations  PALPATION: TTP to  the medial taol-fibular joint space  LOWER EXTREMITY ROM:  AROM for the R ankle is equal to the L Active ROM Right eval Left eval  Hip flexion    Hip extension    Hip abduction    Hip adduction    Hip internal rotation    Hip external rotation    Knee flexion    Knee extension    Ankle dorsiflexion WNLs   Ankle plantarflexion WNLs   Ankle inversion WNLs p   Ankle eversion WNLs p   P=pain  (Blank rows = not tested)  LOWER EXTREMITY MMT:  MMT Right eval Left eval   Hip flexion    Hip extension    Hip abduction    Hip adduction    Hip internal rotation    Hip external rotation    Knee flexion    Knee extension    Ankle dorsiflexion 5   Ankle plantarflexion 5   Ankle inversion 5   Ankle eversion 5    (Blank rows = not tested)  LOWER EXTREMITY SPECIAL TESTS:  Ankle special tests: Anterior drawer test: negative  FUNCTIONAL TESTS:  Single leg standing balance TBA Single leg hop TBA when pain and swelling is decrease  GAIT: Distance walked: 200' Assistive device utilized: None Level of assistance: Complete Independence Comments: WNLs                                                                                                                  TREATMENT DATE:  [facility] Adult PT Treatment:                                                DATE: 05/13/23 Therapeutic Activities: Heel Toe Raises from step, bilat and SL 2x10 each Standing Single Leg Stance with Counter, opp LE knee lifts, semi-circles, abd to knee touch High knee walk c opp LE PF Plyometrics- 2 footed forward and lateral jumps, SL lateral jumps to skater pose, SL jumps, SL spins Therapeutic Exercise: 3 way ankle RTB 2x15  [facility] Adult PT Treatment:                                                DATE: 05/04/23 Therapeutic Activities: Heel Toe Raises from step, bilat and SL 2x10 each Standing Single Leg Stance with Counter, opp LE knee lifts, semi-circles, abd to knee touch Banded side step c band at ankle RTB SL balance green theraband mat c ball toss Plyometrics- 2 footed forward and lateral hops, SL lateral jumps to skater pose, SL jumps and lands 47" each Rocker board A/P and laterally  PATIENT EDUCATION:  Education details: Eval findings, POC, HEP, self care  Person educated: Patient Education method: Explanation, Demonstration, Tactile cues, Verbal cues, and Handouts  Education comprehension: verbalized understanding, returned demonstration, verbal cues required, and tactile  cues required  HOME EXERCISE PROGRAM: Access Code: [email or web address] URL: [email or web address] Date: 04/27/2023 Prepared by: Liborio Reeds  Exercises - Long Sitting Ankle Plantar Flexion with Resistance  - 1 x daily - 7 x weekly - 3 sets - 10 reps - 2 hold - Long Sitting Ankle Eversion with Resistance  - 1 x daily - 7 x weekly - 3 sets - 10 reps - 2 hold - Long Sitting Ankle Inversion with Resistance  - 1 x daily - 7 x weekly - 3 sets - 10 reps - 2 hold - Seated Ankle Dorsiflexion with Resistance  - 1 x daily - 7 x weekly - 3 sets - 10 reps - 2 hold - Heel Toe Raises with Counter Support  - 1 x daily - 7 x weekly - 3 sets - 10 reps - 2 hold - Standing Single Leg Stance with Counter Support  - 1 x daily - 7 x weekly - 1 sets - 5 reps - 30 hold - Side Stepping with Resistance at Ankles  - 1 x daily - 7 x weekly - 3 sets - 20 reps  ASSESSMENT:  CLINICAL IMPRESSION: PT continued to focused on CKC exs for strengthening, balance, and plyometrics. Pt was able to complete all plyometric exs with good stability and only mild R ankle pain after all the exs. 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 Pt is making appropriate progress. Single leg jumps are equal. Pt is to increase her activity level as tolerated and return in 2 weeks. Will assess status of the R ankle at that time to determine future course of care.    EVAL: Patient is a 23 y.o. female who was seen today for physical therapy evaluation and treatment for M25.571 (ICD-10-CM) - Pain in right ankle and joints of right foot. Pt presents to PT 2 months after injuring her R ankle. Pt's signs and symptoms are consistent with a lateral ankle sprain/strain. At this point, ROM and strength are good, but pt is still experiencing swelling and decreased ability to complete higher levels of function with the R ankle due to pain. R ankle stability appears WNLs. A HEP was initiated. Pt will  benefit from skilled PT to address impairments to optimize R ankle/foot function with less pain.   OBJECTIVE IMPAIRMENTS: decreased activity tolerance, difficulty walking, decreased strength, and pain.   ACTIVITY LIMITATIONS: bending, squatting, locomotion level, and jumping  PARTICIPATION LIMITATIONS:  Recreational performance dance   PERSONAL FACTORS: Past/current experiences and Time since onset of injury/illness/exacerbation are also affecting patient's functional outcome.   REHAB POTENTIAL: Excellent  CLINICAL DECISION MAKING: Stable/uncomplicated  EVALUATION COMPLEXITY: Low   GOALS:  SHORT TERM GOALS = LTGs  LONG TERM GOALS: Target date: 06/03/22  Pt will be Ind in a final HEP to maintain achieved LOF  Baseline: started Goal status: ONGOING  2.  Pt will report 75% or greater improvement in R ankle/foot pain with basic dance steps Baseline:  Goal status: INITIAL  3.  Pt will demonstrate single leg balance of the R LE with 90% of the L LE for improved function Baseline: TBA Goal status: INITIAL  4.  Pt will demonstrate single leg PF of the R LE within 90% of the L LE for improved funtion Baseline: TBA Goal status: INITIAL  5.  Pt will demonstrated a single leg hop with the  R LE within 90% of the L LE as indication of ability to return to recreational performance dance Baseline: Assess when pt is able to demonstrate small range single leg plyometric jumps 05/04/23: L and R 47" each with jumps/lands without pain Goal status: MET   PLAN:  PT FREQUENCY: 2x/week  PT DURATION: 6 weeks  PLANNED INTERVENTIONS: 97164- PT Re-evaluation, 97110-Therapeutic exercises, 97530- Therapeutic activity, 97112- Neuromuscular re-education, 97535- Self Care, 40981- Manual therapy, 97116- Gait training, [phone]- Vasopneumatic device, Patient/Family education, Balance training, Stair training, Taping, Dry Needling, Joint mobilization, Cryotherapy, and Moist heat  PLAN FOR NEXT SESSION:  Assess response to HEP; progress therex as indicated; use of modalities, manual therapy; and TPDN as indicated.   Edmonia Gonser MS, PT 05/13/23 1:30 PM

## 2023-05-13 ENCOUNTER — Ambulatory Visit: Attending: Sports Medicine

## 2023-05-13 DIAGNOSIS — M25571 Pain in right ankle and joints of right foot: Secondary | ICD-10-CM | POA: Insufficient documentation

## 2023-05-13 DIAGNOSIS — M6281 Muscle weakness (generalized): Secondary | ICD-10-CM | POA: Insufficient documentation

## 2023-05-13 DIAGNOSIS — R262 Difficulty in walking, not elsewhere classified: Secondary | ICD-10-CM | POA: Insufficient documentation

## 2023-05-26 NOTE — Therapy (Deleted)
 OUTPATIENT PHYSICAL THERAPY LOWER EXTREMITY TREATMENT   Patient Name: Heidi Petersen MRN: [id number] DOB:May 27, 2000, 23 y.o., female Today's Date: 05/26/2023  END OF SESSION:       Past Medical History:  Diagnosis Date   Asthma    Prematurity    Sleep apnea    Past Surgical History:  Procedure Laterality Date   ADENOIDECTOMY     TONSILLECTOMY     WISDOM TOOTH EXTRACTION     Patient Active Problem List   Diagnosis Date Noted   Right shoulder pain 12/02/2020   Hypopigmented skin lesion 11/21/2019   GAD (generalized anxiety disorder) 08/27/2019   Mild intermittent asthma 08/27/2019    PCP: Farris Hong, M  REFERRING PROVIDER: Stafford Eagles, MD   REFERRING DIAG: [phone] (ICD-10-CM) - Pain in right ankle and joints of right foot   THERAPY DIAG:  No diagnosis found.  Rationale for Evaluation and Treatment: Rehabilitation  ONSET DATE: 02/12/23  SUBJECTIVE:   SUBJECTIVE STATEMENT: Pt states her ankle is doing well. Intermittent low pain. 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  EVAL: Pt reports: Injured her R ankle when she twisted it running down steps at work. The pain is improving, but is still experieninces pain and swelling, and her activity level is limited. 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  PERTINENT HISTORY:  PAIN:  Are you having pain? Yes: NPRS scale: 1/10. Pain location: R lateral  Pain description: ache, sharp Aggravating factors: prolonged walking, teaching, dance, jumping and hopping, light jog Relieving factors: Pain medication, ice pack Pain range the week prior to PT: 0-7/10  PRECAUTIONS: None  RED FLAGS: None   WEIGHT BEARING RESTRICTIONS: No  FALLS:  Has patient fallen in last 6 months? Yes. 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   PLOF: Independent  PATIENT GOALS: Return to dance and functioning better  NEXT MD VISIT: 05/16/23  OBJECTIVE:  Note: Objective measures were completed at Evaluation unless otherwise noted.  DIAGNOSTIC FINDINGS: Pt reports xray did not show a fx per her MD visit c Dr. Jacqulyne Maxim  PATIENT SURVEYS:  LEFS 58/80=70% ability  COGNITION: Overall cognitive status: Within functional limits for tasks assessed     SENSATION: WFL  EDEMA:  Mod swelling of the R lateral ankle  MUSCLE LENGTH: Hamstrings: Right WNLs deg; Left WNLs deg Heidi Petersen test: Right NT deg; Left NT deg  POSTURE: No Significant postural limitations  PALPATION: TTP to the medial taol-fibular joint space  LOWER EXTREMITY ROM:  AROM for the R ankle is equal to the L Active ROM Right eval Left eval  Hip flexion    Hip extension    Hip abduction    Hip adduction    Hip internal rotation    Hip external rotation    Knee flexion    Knee extension    Ankle dorsiflexion WNLs   Ankle plantarflexion WNLs   Ankle inversion WNLs p   Ankle eversion WNLs p   P=pain  (Blank rows = not tested)  LOWER EXTREMITY MMT:  MMT Right eval Left eval  Hip  flexion    Hip extension    Hip abduction    Hip adduction    Hip internal rotation    Hip external rotation    Knee flexion    Knee extension    Ankle dorsiflexion 5   Ankle plantarflexion 5   Ankle inversion 5   Ankle eversion 5    (Blank rows = not tested)  LOWER EXTREMITY SPECIAL TESTS:  Ankle special tests: Anterior drawer test: negative  FUNCTIONAL TESTS:  Single leg standing balance TBA Single leg hop TBA when pain and swelling is decrease  GAIT: Distance walked:  200' Assistive device utilized: None Level of assistance: Complete Independence Comments: WNLs                                                                                                                  TREATMENT DATE:  [facility] Adult PT Treatment:                                                DATE: 05/27/23 Therapeutic Exercise: *** Manual Therapy: *** Neuromuscular re-ed: *** Therapeutic Activity: *** Modalities: *** Self Care: ***   Heidi Petersen Adult PT Treatment:                                                DATE: 05/13/23 Therapeutic Activities: Heel Toe Raises from step, bilat and SL 2x10 each Standing Single Leg Stance with Counter, opp LE knee lifts, semi-circles, abd to knee touch High knee walk c opp LE PF Plyometrics- 2 footed forward and lateral jumps, SL lateral jumps to skater pose, SL jumps, SL spins Therapeutic Exercise: 3 way ankle RTB 2x15  [facility] Adult PT Treatment:                                                DATE: 05/04/23 Therapeutic Activities: Heel Toe Raises from step, bilat and SL 2x10 each Standing Single Leg Stance with Counter, opp LE knee lifts, semi-circles, abd to knee touch Banded side step c band at ankle RTB SL balance green theraband mat c ball toss Plyometrics- 2 footed forward and lateral hops, SL lateral jumps to skater pose, SL jumps and lands 47" each Rocker board A/P and laterally  PATIENT EDUCATION:  Education details: Eval findings, POC, HEP, self care  Person educated: Patient Education method: Explanation, Demonstration, Tactile cues, Verbal cues, and Handouts Education comprehension: verbalized understanding, returned demonstration, verbal cues required, and tactile cues required  HOME EXERCISE PROGRAM: Access Code: [email or web address] URL: [email or web address] Date: 04/27/2023 Prepared by: Heidi Petersen  Exercises - Long Sitting Ankle Plantar Flexion with Resistance  -  1 x daily - 7 x weekly - 3 sets - 10 reps - 2 hold -  Long Sitting Ankle Eversion with Resistance  - 1 x daily - 7 x weekly - 3 sets - 10 reps - 2 hold - Long Sitting Ankle Inversion with Resistance  - 1 x daily - 7 x weekly - 3 sets - 10 reps - 2 hold - Seated Ankle Dorsiflexion with Resistance  - 1 x daily - 7 x weekly - 3 sets - 10 reps - 2 hold - Heel Toe Raises with Counter Support  - 1 x daily - 7 x weekly - 3 sets - 10 reps - 2 hold - Standing Single Leg Stance with Counter Support  - 1 x daily - 7 x weekly - 1 sets - 5 reps - 30 hold - Side Stepping with Resistance at Ankles  - 1 x daily - 7 x weekly - 3 sets - 20 reps  ASSESSMENT:  CLINICAL IMPRESSION: PT continued to focused on CKC exs for strengthening, balance, and plyometrics. Pt was able to complete all plyometric exs with good stability and only mild R ankle pain after all the exs. 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 Pt is making appropriate progress. Single leg jumps are equal. Pt is to increase her activity level as tolerated and return in 2 weeks. Will assess status of the R ankle at that time to determine future course of care.    EVAL: Patient is a 23 y.o. female who was seen today for physical therapy evaluation and treatment for M25.571 (ICD-10-CM) - Pain in right ankle and joints of right foot. Pt presents to PT 2 months after injuring her R ankle. Pt's signs and symptoms are consistent with a lateral ankle sprain/strain. At this point, ROM and strength are good, but pt is still experiencing swelling and decreased ability to complete higher levels of function with the R ankle due to pain. R ankle stability appears WNLs. A HEP was initiated. Pt will benefit from skilled PT to address impairments to optimize R ankle/foot function with less pain.   OBJECTIVE IMPAIRMENTS: decreased activity tolerance, difficulty walking, decreased strength, and pain.   ACTIVITY LIMITATIONS: bending, squatting, locomotion level, and  jumping  PARTICIPATION LIMITATIONS: Recreational performance dance   PERSONAL FACTORS: Past/current experiences and Time since onset of injury/illness/exacerbation are also affecting patient's functional outcome.   REHAB POTENTIAL: Excellent  CLINICAL DECISION MAKING: Stable/uncomplicated  EVALUATION COMPLEXITY: Low   GOALS:  SHORT TERM GOALS = LTGs  LONG TERM GOALS: Target date: 06/03/22  Pt will be Ind in a final HEP to maintain achieved LOF  Baseline: started Goal status: ONGOING  2.  Pt will report 75% or greater improvement in R ankle/foot pain with basic dance steps Baseline:  Goal status: INITIAL  3.  Pt will demonstrate single leg balance of the R LE with 90% of the L LE for improved function Baseline: TBA Goal status: INITIAL  4.  Pt will demonstrate single leg PF of the R LE within 90% of the L LE for improved funtion Baseline: TBA Goal status: INITIAL  5.  Pt will demonstrated a single leg hop with the R LE within 90% of the L LE as indication of ability to return to recreational performance dance Baseline: Assess when pt is able to demonstrate small range single leg plyometric jumps 05/04/23: L and R 47" each with jumps/lands without  pain Goal status: MET   PLAN:  PT FREQUENCY: 2x/week  PT DURATION: 6 weeks  PLANNED INTERVENTIONS: 97164- PT Re-evaluation, 97110-Therapeutic exercises, 97530- Therapeutic activity, 97112- Neuromuscular re-education, 97535- Self Care, 16109- Manual therapy, [phone]- Gait training, [phone]- Vasopneumatic device, Patient/Family education, Balance training, Stair training, Taping, Dry Needling, Joint mobilization, Cryotherapy, and Moist heat  PLAN FOR NEXT SESSION: Assess response to HEP; progress therex as indicated; use of modalities, manual therapy; and TPDN as indicated.   Heidi Guthridge MS, PT 05/26/23 1:17 PM

## 2023-05-27 ENCOUNTER — Ambulatory Visit

## 2023-06-10 ENCOUNTER — Ambulatory Visit (INDEPENDENT_AMBULATORY_CARE_PROVIDER_SITE_OTHER): Admitting: Family Medicine

## 2023-06-10 ENCOUNTER — Other Ambulatory Visit (HOSPITAL_COMMUNITY)
Admission: RE | Admit: 2023-06-10 | Discharge: 2023-06-10 | Disposition: A | Discharge status: Home / Self Care | Source: Ambulatory Visit | Attending: Family Medicine | Admitting: Family Medicine

## 2023-06-10 ENCOUNTER — Encounter: Payer: Self-pay | Admitting: Family Medicine

## 2023-06-10 VITALS — BP 103/72 | HR 70 | Ht 66.0 in | Wt 123.5 lb

## 2023-06-10 DIAGNOSIS — Z113 Encounter for screening for infections with a predominantly sexual mode of transmission: Secondary | ICD-10-CM | POA: Diagnosis not present

## 2023-06-10 DIAGNOSIS — R142 Eructation: Secondary | ICD-10-CM

## 2023-06-10 LAB — POCT URINE PREGNANCY: Preg Test, Ur: NEGATIVE

## 2023-06-10 NOTE — Progress Notes (Cosign Needed Addendum)
    SUBJECTIVE:   CHIEF COMPLAINT / HPI:  Uncontrollable burping - For 3 weeks - Has tried Gas-X - Endorses primarily gasping for air with exercise, but not otherwise at rest - Diet: eats fatty foods frequently (french fries), frequently eats veggies, eats lentils as well - Drinks sodas occasionally, but not frequently; no smoking, doesn't chew gum, drink coffee, doesn't gulp foods/drinks - Patient denies chest pain, abdominal pain or distention, acid reflux, nausea, vomiting, or diarrhea  STI testing - LMP May 8th, not on birth control - Had unprotected sexual activity this past Tuesday 5/27, partner pulled out; prior to this was 2 years ago - Was previously on OCP (Junel Fe), had difficulty taking something everyday - Denies any vaginal discharge or odor  PERTINENT  PMH / PSH: Sleep apnea as a child (no longer uses CPAP), asthma  OBJECTIVE:   BP 103/72   Pulse 70   Ht 5\' 6"  (1.676 m)   Wt 123 lb 8 oz (56 kg)   LMP 05/19/2023   SpO2 100%   BMI 19.93 kg/m   General: Awake and Alert in NAD HEENT: NCAT. Sclera anicteric. No rhinorrhea. Cardiovascular: RRR. No M/R/G Respiratory: CTAB, normal WOB on RA. No wheezing, crackles, rhonchi, or diminished breath sounds. Abdomen: Soft, non-tender, non-distended. Bowel sounds normoactive Extremities: Able to move all extremities. No BLE edema, no deformities or significant joint findings. Skin: Warm and dry. No abrasions or rashes noted. Neuro: A&Ox3. No focal neurological deficits. F GU: Normally developed genitalia with no external lesions or eruptions. Vagina and cervix show no lesions, inflammation, discharge or tenderness. No cystocele. Chaperoned by CMA Tashira.   ASSESSMENT/PLAN:   Assessment & Plan Burping Burping for about 3 weeks. Has tried Gas-X.  Ddx: GI (GERD, H pylori, gastroparesis, IBS), Diet (fatty foods, veggies, lentils), Psychogenic, Aerophagia - Discussed keeping a food diary to monitor what foods  trigger/worsen the burping and avoiding trigger foods - Continue trying Gas-X - Advised diaphragmatic breathing - Follow-up in one week if symptoms continue to persist for further testing Screening examination for STD (sexually transmitted disease) Recently had unprotected sex with a new partner.  - STD testing performed today, denied HIV and RPR - POCT urine pregnancy negative today, advised safe sex and using condoms consistently if not on birth control. Recommended using Plan B if concerned about pregnancy - STD and Safe Sex Practices provided in AVS   Clyda Dark, DO [facility]

## 2023-06-10 NOTE — Patient Instructions (Addendum)
 It was great to see you today! Thank you for choosing [facility] for your primary care. Heidi Petersen was seen for uncontrollable burping and STD screening.  Today we addressed: Excessive burping - continue using Gas-X, try diaphragmatic breathing exercise discussed during visit, keep a food diary/how often the burping occurs and what is associated with. Foods that cause excessive burping Beans Carbonated beverages Vegetables - broccoli, cauliflower Fatty foods or fried foods Dairy products if you are lactose intolerant If your symptoms improve within the next week with these changes that is great!  If they have continue please bring in the food diary and see us  again. Return precautions - if you have acute or persistent abdominal pain that is unrelenting please come in to the office for further testing  STD and Safe Sex Practices - Important to note: - STDs can occur without symptoms - "Pulling out" is not an effective form of protection from pregnancy or STDs - Oral sex can transmit STDs too - Abstinence or being in a long-term, mutually monogamous relationship with an uninfected partner is the most reliable way to prevent STDs - Correct and consistent use of condoms can significantly reduce the risk of STDs - Limiting the number of sex partners and modifying other risky behaviors can decrease the risk of STDs - HPV vaccination is recommended for females up to age 48 and males up to age 2 (33 for men who have sex with men) - HIV testing is offered to anyone diagnosed with an STD, and pre-exposure prophylaxis may be appropriate for sexually active individuals. Post exposure prophylaxis is recommended within 72 hours of potential exposure - Please notify sexual partners if diagnosed with an STD. Expedited Partner Therapy (EPT) may be available for some infections  We are checking some labs today. I will send you a MyChart message with your results, per your preference. If you do  not hear about your labs in the next 2 weeks, please call the office.  You should return to our clinic Return if symptoms worsen or fail to improve. Please arrive 15 minutes before your appointment to ensure smooth check in process.  We appreciate your efforts in making this happen.  Thank you for allowing me to participate in your care, Clyda Dark, DO 06/10/2023, 4:28 PM PGY-1, [facility]

## 2023-06-14 ENCOUNTER — Ambulatory Visit: Payer: Self-pay | Admitting: Family Medicine

## 2023-06-14 LAB — CERVICOVAGINAL ANCILLARY ONLY
Bacterial Vaginitis (gardnerella): POSITIVE — AB
Candida Glabrata: NEGATIVE
Candida Vaginitis: NEGATIVE
Chlamydia: NEGATIVE
Comment: NEGATIVE
Comment: NEGATIVE
Comment: NEGATIVE
Comment: NEGATIVE
Comment: NEGATIVE
Comment: NORMAL
Neisseria Gonorrhea: NEGATIVE
Trichomonas: NEGATIVE

## 2023-06-14 MED ORDER — METRONIDAZOLE 500 MG PO TABS: 500.0000 mg | ORAL_TABLET | Freq: Two times a day (BID) | ORAL | 0 refills | Status: AC

## 2023-12-12 ENCOUNTER — Telehealth

## 2023-12-12 ENCOUNTER — Encounter: Payer: Self-pay | Admitting: Family Medicine

## 2023-12-12 ENCOUNTER — Telehealth: Payer: Self-pay

## 2023-12-12 ENCOUNTER — Other Ambulatory Visit (HOSPITAL_COMMUNITY)
Admission: RE | Admit: 2023-12-12 | Discharge: 2023-12-12 | Disposition: A | Discharge status: Home / Self Care | Source: Ambulatory Visit | Attending: Family Medicine | Admitting: Family Medicine

## 2023-12-12 ENCOUNTER — Ambulatory Visit: Admitting: Family Medicine

## 2023-12-12 VITALS — BP 100/60 | HR 100 | Wt 120.0 lb

## 2023-12-12 DIAGNOSIS — Z113 Encounter for screening for infections with a predominantly sexual mode of transmission: Secondary | ICD-10-CM | POA: Insufficient documentation

## 2023-12-12 DIAGNOSIS — J069 Acute upper respiratory infection, unspecified: Secondary | ICD-10-CM

## 2023-12-12 DIAGNOSIS — R142 Eructation: Secondary | ICD-10-CM

## 2023-12-12 MED ORDER — OMEPRAZOLE 20 MG PO CPDR: 20.0000 mg | DELAYED_RELEASE_CAPSULE | Freq: Every day | ORAL | 3 refills | Status: AC

## 2023-12-12 MED ORDER — AZELASTINE HCL 0.1 % NA SOLN: 2.0000 | Freq: Two times a day (BID) | NASAL | 0 refills | Status: AC

## 2023-12-12 MED ORDER — BENZONATATE 100 MG PO CAPS: 100.0000 mg | ORAL_CAPSULE | Freq: Two times a day (BID) | ORAL | 0 refills | Status: AC | PRN

## 2023-12-12 NOTE — Patient Instructions (Addendum)
 It was wonderful to see you today.  Please bring ALL of your medications with you to every visit.   Today we talked about:  We did your STI testing.  For your burping: lets start with antiacid medication and see if this helps  Thank you for choosing [facility].   Please call [phone] with any questions about today's appointment.  Please arrive at least 15 minutes prior to your scheduled appointments.   If you had blood work today, I will send you a MyChart message or a letter if results are normal. Otherwise, I will give you a call.   If you had a referral placed, they will call you to set up an appointment. Please give us  a call if you don't hear back in the next 2 weeks.   If you need additional refills before your next appointment, please call your pharmacy first.   Do you need your medications delivered to your home?   We'll send your prescription to the [facility] for delivery.          Address: [address] [place], [place], [place] [address]          Phone: [phone]  Please call the [facility] to speak with a pharmacist and set up your home medication delivery. If you have any questions, feel free to contact us  -- we're happy to help!  Other [facility] that offer affordable prices on both prescriptions and over-the-counter items, as well as convenient services like vaccinations, are  [facility], at [facility]         Address:  [address], [place], [place] [address]         Phone: [phone]  [facility], located in the [facility]        Address: [address] [place], [place] [address]        Phone: [phone]  [facility], at [facility]       Address: [address], [place], [place] [address]       Phone: [phone]  [facility], at [facility]       Address: [address], [place], [place] [address]       Phone: [phone]  You should follow up in our clinic in No follow-ups on file.  Gloriann Ogren, MD Family Medicine

## 2023-12-12 NOTE — Telephone Encounter (Signed)
 Patient calls nurse line. She had a virtual visit with NP earlier today regarding a URI and visit with Dr. Lonnie later today.   She has been having symptoms for the last four days.   She forgot to discuss continued cold symptoms for the last four days. She has been having cough, congestion and headache. No fever.   At home COVID test was negative. She has been taking OTC cold medications. She was asking if she needs to be doing anything differently.   Discussed at home supportive measures. Discussed that I would forward message to PCP for further advisement.   Chiquita JAYSON English, RN

## 2023-12-12 NOTE — Progress Notes (Signed)
    SUBJECTIVE:   CHIEF COMPLAINT / HPI:   - STI testing, no symptoms but started a new relationship would like to get tested -Has been involuntary belching since May.  Was seen in May and was told to try a food diary and diaphragmatic breathing.  Reports since May its gotten better so she has not been doing so.  She does report that she still has the belching and it gets worse when she is stressed.  Denies any reflux or abdominal pain.  Feels a bulging is worse in the afternoon and not really related to what she eats.  Discussed vaccines today, patient would like to defer   OBJECTIVE:   BP 100/60   Pulse 100   Wt 120 lb (54.4 kg)   LMP 12/04/2023   SpO2 98%   BMI 19.37 kg/m   General: A&O, NAD HEENT: No sign of trauma, EOM grossly intact Respiratory: normal WOB GI: non-distended  Extremities: no peripheral edema. Neuro: Normal gait, moves all four extremities appropriately Skin: no lesions/rashes visualized Psych: Appropriate mood and affect  GU: Normal appearance of labia majora and minora, without lesions. Vagina tissue pink, moist, without lesions or abrasions. Cervix normal appearance, non-friable, without discharge from os.   ASSESSMENT/PLAN:   Assessment & Plan Screening examination for STD (sexually transmitted disease) Physical exam unremarkable.  Patient asymptomatic.   - STD testing Belching Ongoing belching since May.  Has tried Gas-X previously without any help.  Feels like its gotten better since May but is still occurring.  Does get worse with stress.  Is unclear if this is food related.  Suspect this may be GERD versus psychogenic. - Trial omeprazole - Advise diaphragmatic breathing -Will follow-up if symptoms continue to persist     Gloriann Ogren, MD [facility]

## 2023-12-12 NOTE — Progress Notes (Signed)
 We are sorry you are not feeling well.  Here is how we plan to help!  Based on what you have shared with me, it looks like you may have a viral upper respiratory infection.  Upper respiratory infections are caused by a large number of viruses; however, rhinovirus is the most common cause. COVID is also a possibility and I recommend you test yourself for COVID at home.   Symptoms vary from person to person, with common symptoms including sore throat, cough, and fatigue or lack of energy, and a feeling of general discomfort.  A low-grade fever of up to 100.4 may present, but is often uncommon.  Symptoms vary however, and are closely related to a person's age or underlying illnesses.  The most common symptoms associated with an upper respiratory infection are nasal discharge or congestion, cough, sneezing, headache and pressure in the ears and face.  These symptoms usually persist for about 3 to 10 days, but can last up to 2 weeks.  It is important to know that upper respiratory infections do not cause serious illness or complications in most cases.    Upper respiratory infections can be transmitted from person to person, with the most common method of transmission being a person's hands.  The virus is able to live on the skin and can infect other persons for up to 2 hours after direct contact.  Also, these can be transmitted when someone coughs or sneezes; thus, it is important to cover the mouth to reduce this risk.  To keep the spread of the illness at bay, good hand hygiene is very important!  Because this is a viral infection, there are no specific treatments other than to help you with the symptoms until the infection runs its course.  Antibiotics are not recommended by the Infectious Disease Society of America unless you have severe symptoms (including high fever) or you have symptoms for more than 10 days. If you still have symptoms after 10 days, antibiotics should be considered.    For nasal  congestion, you may use an oral decongestants such as Mucinex D or if you have glaucoma or high blood pressure use plain Mucinex.    Saline nasal spray or nasal drops can help and can safely be used as often as needed for congestion.  Try using saline irrigation, such as with a neti pot, several times a day while you are sick. Many neti pots come with salt packets premeasured to use to make saline. If you use your own salt, make sure it is kosher salt or sea salt (don't use table salt as it has iodine in it and you don't need that in your nose). Use distilled water to make saline. If you mix your own saline using your own salt, the recipe is 1/4 teaspoon salt in 1 cup warm water. Using saline irrigation can help prevent and treat sinus infections.   For your congestion, I have prescribed Azelastine nasal spray two sprays in each nostril twice a day  If you do not have a history of heart disease, hypertension, diabetes or thyroid disease, prostate/bladder issues or glaucoma, you may also use Sudafed to treat nasal congestion.  It is highly recommended that you consult with a pharmacist or your primary care physician to ensure this medication is safe for you to take.     If you have a cough, you may use over-the-counter cough suppressants such as Delsym and Robitussin.  If you have glaucoma or high blood pressure, you  can also use Coricidin HBP.   For cough I have prescribed for you A prescription cough medication called Tessalon Perles 100 mg. You may take 1-2 capsules every 8 hours as needed for cough  If you have a sore or scratchy throat, use a saltwater gargle-  to  teaspoon of salt dissolved in a 4-ounce to 8-ounce glass of warm water.  Gargle the solution for approximately 15-30 seconds and then spit.  It is important not to swallow the solution.  You can also use throat lozenges/cough drops and Chloraseptic spray to help with throat pain or discomfort.  Warm or cold liquids can also be helpful in  relieving throat pain.   For headache, pain, or general discomfort, you can use Ibuprofen or Tylenol as directed.   Some authorities believe that zinc sprays or the use of Echinacea may shorten the course of your symptoms.   HOME CARE Only take medications as instructed by your medical team. Be sure to drink plenty of fluids. Water is fine as well as fruit juices, sodas and electrolyte beverages. You may want to stay away from caffeine or alcohol. If you are nauseated, try taking small sips of liquids. How do you know if you are getting enough fluid? Your urine should be a pale yellow or almost colorless. Get rest. Taking a steamy shower or using a humidifier may help nasal congestion and ease sore throat pain. You can place a towel over your head and breathe in the steam from hot water coming from a faucet. Using a saline nasal spray works much the same way. Cough drops, hard candies and sore throat lozenges may ease your cough. Avoid close contacts especially the very young and the elderly Cover your mouth if you cough or sneeze Always remember to wash your hands.   GET HELP RIGHT AWAY IF: You develop worsening fever. If your symptoms do not improve within 10 days You develop yellow or green discharge from your nose over 3 days. You have coughing fits You develop a severe head ache or visual changes. You develop shortness of breath, difficulty breathing or start having chest pain Your symptoms persist after you have completed your treatment plan  MAKE SURE YOU  Understand these instructions. Will watch your condition. Will get help right away if you are not doing well or get worse.  Your e-visit answers were reviewed by a board certified advanced clinical practitioner to complete your personal care plan. Depending upon the condition, your plan could have included both over-the-counter or prescription medications.  Please review your pharmacy choice. If there is a problem, you may  call our nursing hot line at and have the prescription routed to another pharmacy. Your safety is important to us . If you have drug allergies, check your prescription carefully.   You can use MyChart to ask questions about today's visit, request a non-urgent call back, or ask for a work or school excuse for 24 hours related to this e-Visit. If it has been greater than 24 hours you will need to follow up with your provider, or enter a new e-Visit to address those concerns. You will get an e-mail in the next two days asking about your experience.  I hope that your e-visit has been valuable and will speed your recovery. Thank you for using e-visits.   I have spent 5 minutes in review of e-visit questionnaire, review and updating patient chart, medical decision making and response to patient.   Jon Belt, PhD, FNP-BC

## 2023-12-13 ENCOUNTER — Ambulatory Visit: Payer: Self-pay | Admitting: Family Medicine

## 2023-12-13 LAB — SYPHILIS: RPR W/REFLEX TO RPR TITER AND TREPONEMAL ANTIBODIES, TRADITIONAL SCREENING AND DIAGNOSIS ALGORITHM: RPR Ser Ql: NONREACTIVE

## 2023-12-13 LAB — HIV ANTIBODY (ROUTINE TESTING W REFLEX): HIV Screen 4th Generation wRfx: NONREACTIVE

## 2023-12-13 NOTE — Telephone Encounter (Signed)
 Called patient, she did not answer.   Mychart message sent.   Chiquita JAYSON English, RN

## 2023-12-14 LAB — CERVICOVAGINAL ANCILLARY ONLY
Chlamydia: NEGATIVE
Comment: NEGATIVE
Comment: NEGATIVE
Comment: NORMAL
Neisseria Gonorrhea: NEGATIVE
Trichomonas: NEGATIVE
